# Patient Record
Sex: Female | Born: 1950 | Race: White | Hispanic: No | Smoking: Never smoker
Health system: Southern US, Community
[De-identification: ages and names within clinical notes are randomized; demographics above are authoritative.]

## PROBLEM LIST (undated history)

## (undated) DIAGNOSIS — T8859XA Other complications of anesthesia, initial encounter: Secondary | ICD-10-CM

## (undated) DIAGNOSIS — M1990 Unspecified osteoarthritis, unspecified site: Secondary | ICD-10-CM

## (undated) DIAGNOSIS — Z9889 Other specified postprocedural states: Secondary | ICD-10-CM

## (undated) DIAGNOSIS — R2231 Localized swelling, mass and lump, right upper limb: Secondary | ICD-10-CM

## (undated) DIAGNOSIS — Z8489 Family history of other specified conditions: Secondary | ICD-10-CM

## (undated) DIAGNOSIS — K219 Gastro-esophageal reflux disease without esophagitis: Secondary | ICD-10-CM

## (undated) DIAGNOSIS — R112 Nausea with vomiting, unspecified: Secondary | ICD-10-CM

## (undated) DIAGNOSIS — T4145XA Adverse effect of unspecified anesthetic, initial encounter: Secondary | ICD-10-CM

## (undated) DIAGNOSIS — C801 Malignant (primary) neoplasm, unspecified: Secondary | ICD-10-CM

## (undated) HISTORY — PX: BREAST SURGERY: SHX581

## (undated) HISTORY — PX: TONSILLECTOMY: SUR1361

## (undated) HISTORY — PX: TUBAL LIGATION: SHX77

## (undated) HISTORY — PX: OTHER SURGICAL HISTORY: SHX169

## (undated) HISTORY — PX: DILATION AND CURETTAGE OF UTERUS: SHX78

## (undated) HISTORY — PX: COLONOSCOPY: SHX174

## (undated) HISTORY — PX: ABDOMINAL HYSTERECTOMY: SHX81

## (undated) HISTORY — PX: CARPAL TUNNEL RELEASE: SHX101

---

## 1978-04-26 DIAGNOSIS — K759 Inflammatory liver disease, unspecified: Secondary | ICD-10-CM

## 1978-04-26 HISTORY — DX: Inflammatory liver disease, unspecified: K75.9

## 1999-02-09 ENCOUNTER — Other Ambulatory Visit: Admission: RE | Admit: 1999-02-09 | Discharge: 1999-02-09 | Payer: Self-pay | Admitting: Obstetrics and Gynecology

## 1999-05-20 ENCOUNTER — Ambulatory Visit (HOSPITAL_BASED_OUTPATIENT_CLINIC_OR_DEPARTMENT_OTHER): Admission: RE | Admit: 1999-05-20 | Discharge: 1999-05-20 | Payer: Self-pay | Admitting: Orthopedic Surgery

## 2000-10-07 ENCOUNTER — Other Ambulatory Visit: Admission: RE | Admit: 2000-10-07 | Discharge: 2000-10-07 | Payer: Self-pay | Admitting: Obstetrics and Gynecology

## 2000-12-30 ENCOUNTER — Ambulatory Visit (HOSPITAL_BASED_OUTPATIENT_CLINIC_OR_DEPARTMENT_OTHER): Admission: RE | Admit: 2000-12-30 | Discharge: 2000-12-30 | Payer: Self-pay | Admitting: Orthopedic Surgery

## 2001-01-20 ENCOUNTER — Ambulatory Visit (HOSPITAL_BASED_OUTPATIENT_CLINIC_OR_DEPARTMENT_OTHER): Admission: RE | Admit: 2001-01-20 | Discharge: 2001-01-20 | Payer: Self-pay | Admitting: Orthopedic Surgery

## 2006-02-17 ENCOUNTER — Ambulatory Visit: Payer: Self-pay | Admitting: Otolaryngology

## 2006-05-16 ENCOUNTER — Ambulatory Visit: Payer: Self-pay | Admitting: Unknown Physician Specialty

## 2013-02-27 DIAGNOSIS — J381 Polyp of vocal cord and larynx: Secondary | ICD-10-CM | POA: Insufficient documentation

## 2013-02-27 DIAGNOSIS — C50919 Malignant neoplasm of unspecified site of unspecified female breast: Secondary | ICD-10-CM | POA: Insufficient documentation

## 2013-02-27 DIAGNOSIS — R232 Flushing: Secondary | ICD-10-CM | POA: Insufficient documentation

## 2013-02-27 DIAGNOSIS — N6099 Unspecified benign mammary dysplasia of unspecified breast: Secondary | ICD-10-CM | POA: Insufficient documentation

## 2013-02-27 DIAGNOSIS — K621 Rectal polyp: Secondary | ICD-10-CM | POA: Insufficient documentation

## 2013-02-27 DIAGNOSIS — Z90711 Acquired absence of uterus with remaining cervical stump: Secondary | ICD-10-CM | POA: Insufficient documentation

## 2013-02-27 DIAGNOSIS — Z9089 Acquired absence of other organs: Secondary | ICD-10-CM | POA: Insufficient documentation

## 2013-05-26 ENCOUNTER — Ambulatory Visit: Payer: Self-pay | Admitting: Otolaryngology

## 2015-06-04 ENCOUNTER — Other Ambulatory Visit: Payer: Self-pay | Admitting: Ophthalmology

## 2015-06-04 DIAGNOSIS — H5711 Ocular pain, right eye: Secondary | ICD-10-CM

## 2015-06-20 ENCOUNTER — Ambulatory Visit: Payer: Self-pay

## 2015-06-27 ENCOUNTER — Ambulatory Visit: Admission: RE | Admit: 2015-06-27 | Payer: Commercial Managed Care - HMO | Source: Ambulatory Visit

## 2015-06-27 ENCOUNTER — Other Ambulatory Visit: Payer: Self-pay | Admitting: Ophthalmology

## 2015-06-27 ENCOUNTER — Ambulatory Visit
Admission: RE | Admit: 2015-06-27 | Discharge: 2015-06-27 | Disposition: A | Discharge status: Home / Self Care | Payer: Commercial Managed Care - HMO | Source: Ambulatory Visit | Attending: Ophthalmology | Admitting: Ophthalmology

## 2015-06-27 DIAGNOSIS — H5711 Ocular pain, right eye: Secondary | ICD-10-CM | POA: Insufficient documentation

## 2015-06-27 DIAGNOSIS — R9082 White matter disease, unspecified: Secondary | ICD-10-CM | POA: Insufficient documentation

## 2015-06-27 MED ORDER — GADOBENATE DIMEGLUMINE 529 MG/ML IV SOLN
15.0000 mL | Freq: Once | INTRAVENOUS | Status: AC | PRN
  Administered 2015-06-27: 15 mL via INTRAVENOUS

## 2016-10-21 ENCOUNTER — Encounter: Payer: Self-pay | Admitting: *Deleted

## 2016-10-26 ENCOUNTER — Ambulatory Visit: Payer: Commercial Managed Care - HMO | Admitting: Registered Nurse

## 2016-10-26 ENCOUNTER — Ambulatory Visit
Admission: RE | Admit: 2016-10-26 | Discharge: 2016-10-26 | Disposition: A | Discharge status: Home / Self Care | Payer: Commercial Managed Care - HMO | Source: Ambulatory Visit | Attending: Ophthalmology | Admitting: Ophthalmology

## 2016-10-26 ENCOUNTER — Encounter
Admission: RE | Disposition: A | Discharge status: Home / Self Care | Payer: Self-pay | Source: Ambulatory Visit | Attending: Ophthalmology

## 2016-10-26 ENCOUNTER — Encounter: Payer: Self-pay | Admitting: *Deleted

## 2016-10-26 DIAGNOSIS — H2512 Age-related nuclear cataract, left eye: Secondary | ICD-10-CM | POA: Insufficient documentation

## 2016-10-26 DIAGNOSIS — Z853 Personal history of malignant neoplasm of breast: Secondary | ICD-10-CM | POA: Insufficient documentation

## 2016-10-26 DIAGNOSIS — K219 Gastro-esophageal reflux disease without esophagitis: Secondary | ICD-10-CM | POA: Insufficient documentation

## 2016-10-26 DIAGNOSIS — M1990 Unspecified osteoarthritis, unspecified site: Secondary | ICD-10-CM | POA: Diagnosis not present

## 2016-10-26 DIAGNOSIS — Z888 Allergy status to other drugs, medicaments and biological substances status: Secondary | ICD-10-CM | POA: Insufficient documentation

## 2016-10-26 DIAGNOSIS — Z85828 Personal history of other malignant neoplasm of skin: Secondary | ICD-10-CM | POA: Insufficient documentation

## 2016-10-26 DIAGNOSIS — Z881 Allergy status to other antibiotic agents status: Secondary | ICD-10-CM | POA: Diagnosis not present

## 2016-10-26 HISTORY — DX: Adverse effect of unspecified anesthetic, initial encounter: T41.45XA

## 2016-10-26 HISTORY — DX: Gastro-esophageal reflux disease without esophagitis: K21.9

## 2016-10-26 HISTORY — DX: Malignant (primary) neoplasm, unspecified: C80.1

## 2016-10-26 HISTORY — DX: Nausea with vomiting, unspecified: R11.2

## 2016-10-26 HISTORY — DX: Other complications of anesthesia, initial encounter: T88.59XA

## 2016-10-26 HISTORY — DX: Unspecified osteoarthritis, unspecified site: M19.90

## 2016-10-26 HISTORY — PX: CATARACT EXTRACTION W/PHACO: SHX586

## 2016-10-26 HISTORY — DX: Other specified postprocedural states: Z98.890

## 2016-10-26 SURGERY — PHACOEMULSIFICATION, CATARACT, WITH IOL INSERTION
Anesthesia: Monitor Anesthesia Care | Site: Eye | Laterality: Left | Wound class: Clean

## 2016-10-26 MED ORDER — CARBACHOL 0.01 % IO SOLN
INTRAOCULAR | Status: DC | PRN
Start: 2016-10-26 — End: 2016-10-26
  Administered 2016-10-26: .5 mL via INTRAOCULAR

## 2016-10-26 MED ORDER — LIDOCAINE HCL (PF) 4 % IJ SOLN
INTRAOCULAR | Status: DC | PRN
  Administered 2016-10-26: 2 mL via OPHTHALMIC

## 2016-10-26 MED ORDER — BSS IO SOLN
INTRAOCULAR | Status: DC | PRN
  Administered 2016-10-26: 1 mL via OPHTHALMIC

## 2016-10-26 MED ORDER — NA CHONDROIT SULF-NA HYALURON 40-17 MG/ML IO SOLN
INTRAOCULAR | Status: AC
  Filled 2016-10-26: qty 1

## 2016-10-26 MED ORDER — ARMC OPHTHALMIC DILATING DROPS
OPHTHALMIC | Status: AC
  Filled 2016-10-26: qty 0.4

## 2016-10-26 MED ORDER — MOXIFLOXACIN HCL 0.5 % OP SOLN
OPHTHALMIC | Status: AC
  Filled 2016-10-26: qty 3

## 2016-10-26 MED ORDER — SODIUM CHLORIDE 0.9 % IV SOLN
INTRAVENOUS | Status: DC
  Administered 2016-10-26: 10:00:00 via INTRAVENOUS

## 2016-10-26 MED ORDER — MIDAZOLAM HCL 2 MG/2ML IJ SOLN
INTRAMUSCULAR | Status: AC
  Filled 2016-10-26: qty 2

## 2016-10-26 MED ORDER — POVIDONE-IODINE 5 % OP SOLN
OPHTHALMIC | Status: AC
  Filled 2016-10-26: qty 30

## 2016-10-26 MED ORDER — LIDOCAINE HCL (PF) 4 % IJ SOLN
INTRAMUSCULAR | Status: AC
  Filled 2016-10-26: qty 5

## 2016-10-26 MED ORDER — MOXIFLOXACIN HCL 0.5 % OP SOLN
OPHTHALMIC | Status: DC | PRN
Start: 2016-10-26 — End: 2016-10-26
  Administered 2016-10-26: .2 mL via OPHTHALMIC

## 2016-10-26 MED ORDER — MIDAZOLAM HCL 2 MG/2ML IJ SOLN
INTRAMUSCULAR | Status: DC | PRN
  Administered 2016-10-26: 1 mg via INTRAVENOUS

## 2016-10-26 MED ORDER — POVIDONE-IODINE 5 % OP SOLN
OPHTHALMIC | Status: DC | PRN
  Administered 2016-10-26: 1 via OPHTHALMIC

## 2016-10-26 MED ORDER — MOXIFLOXACIN HCL 0.5 % OP SOLN: 1.0000 [drp] | OPHTHALMIC | Status: DC | PRN

## 2016-10-26 MED ORDER — FENTANYL CITRATE (PF) 100 MCG/2ML IJ SOLN
INTRAMUSCULAR | Status: DC | PRN
  Administered 2016-10-26: 50 ug via INTRAVENOUS

## 2016-10-26 MED ORDER — NA CHONDROIT SULF-NA HYALURON 40-17 MG/ML IO SOLN
INTRAOCULAR | Status: DC | PRN
  Administered 2016-10-26: 1 mL via INTRAOCULAR

## 2016-10-26 MED ORDER — ONDANSETRON HCL 4 MG/2ML IJ SOLN
INTRAMUSCULAR | Status: AC
  Filled 2016-10-26: qty 2

## 2016-10-26 MED ORDER — ARMC OPHTHALMIC DILATING DROPS
1.0000 "application " | OPHTHALMIC | Status: AC
  Administered 2016-10-26 (×3): 1 via OPHTHALMIC

## 2016-10-26 MED ORDER — ONDANSETRON HCL 4 MG/2ML IJ SOLN
INTRAMUSCULAR | Status: DC | PRN
  Administered 2016-10-26: 4 mg via INTRAVENOUS

## 2016-10-26 MED ORDER — FENTANYL CITRATE (PF) 100 MCG/2ML IJ SOLN
INTRAMUSCULAR | Status: AC
  Filled 2016-10-26: qty 2

## 2016-10-26 MED ORDER — EPINEPHRINE PF 1 MG/ML IJ SOLN
INTRAMUSCULAR | Status: AC
  Filled 2016-10-26: qty 1

## 2016-10-26 SURGICAL SUPPLY — 14 items
GLOVE BIO SURGEON STRL SZ8 (GLOVE) ×2 IMPLANT
GLOVE BIOGEL M 6.5 STRL (GLOVE) ×2 IMPLANT
GLOVE SURG LX 8.0 MICRO (GLOVE) ×1
GLOVE SURG LX STRL 8.0 MICRO (GLOVE) ×1 IMPLANT
GOWN STRL REUS W/ TWL LRG LVL3 (GOWN DISPOSABLE) ×2 IMPLANT
GOWN STRL REUS W/TWL LRG LVL3 (GOWN DISPOSABLE) ×2
LENS IOL TECNIS ITEC 25.5 (Intraocular Lens) ×2 IMPLANT
PACK CATARACT (MISCELLANEOUS) ×2 IMPLANT
PACK CATARACT BRASINGTON LX (MISCELLANEOUS) ×2 IMPLANT
SOL BSS BAG (MISCELLANEOUS) ×2
SOLUTION BSS BAG (MISCELLANEOUS) ×1 IMPLANT
SYR 5ML LL (SYRINGE) ×2 IMPLANT
WATER STERILE IRR 250ML POUR (IV SOLUTION) ×2 IMPLANT
WIPE NON LINTING 3.25X3.25 (MISCELLANEOUS) ×2 IMPLANT

## 2016-10-26 NOTE — Anesthesia Preprocedure Evaluation (Signed)
Anesthesia Evaluation  Patient identified by MRN, date of birth, ID band Patient awake    Reviewed: Allergy & Precautions, H&P , NPO status , Patient's Chart, lab work & pertinent test results  History of Anesthesia Complications (+) PONV and history of anesthetic complications  Airway Mallampati: III  TM Distance: <3 FB Neck ROM: limited    Dental  (+) Chipped, Caps   Pulmonary neg pulmonary ROS,           Cardiovascular Exercise Tolerance: Good (-) angina(-) Past MI negative cardio ROS       Neuro/Psych negative neurological ROS  negative psych ROS   GI/Hepatic Neg liver ROS, GERD  Medicated and Controlled,  Endo/Other  negative endocrine ROS  Renal/GU   negative genitourinary   Musculoskeletal  (+) Arthritis ,   Abdominal   Peds  Hematology negative hematology ROS (+)   Anesthesia Other Findings Past Medical History: No date: Arthritis No date: Cancer (HCC)     Comment: BREAST No date: Complication of anesthesia     Comment: NO RECENT N/V No date: GERD (gastroesophageal reflux disease) No date: PONV (postoperative nausea and vomiting)     Comment: H/O  Past Surgical History: No date: ABDOMINAL HYSTERECTOMY No date: BREAST SURGERY     Comment: BX/ LUMPECTOMY No date: COLONOSCOPY No date: CTR No date: DILATION AND CURETTAGE OF UTERUS No date: TONSILLECTOMY No date: TUBAL LIGATION  BMI    Body Mass Index:  28.35 kg/m      Reproductive/Obstetrics negative OB ROS                             Anesthesia Physical Anesthesia Plan  ASA: III  Anesthesia Plan: MAC   Post-op Pain Management:    Induction: Intravenous  PONV Risk Score and Plan:   Airway Management Planned: Natural Airway and Nasal Cannula  Additional Equipment:   Intra-op Plan:   Post-operative Plan:   Informed Consent: I have reviewed the patients History and Physical, chart, labs and discussed  the procedure including the risks, benefits and alternatives for the proposed anesthesia with the patient or authorized representative who has indicated his/her understanding and acceptance.   Dental Advisory Given  Plan Discussed with: Anesthesiologist, CRNA and Surgeon  Anesthesia Plan Comments: (Patient consented for risks of anesthesia including but not limited to:  - adverse reactions to medications - damage to teeth, lips or other oral mucosa - sore throat or hoarseness - Damage to heart, brain, lungs or loss of life  Patient voiced understanding.)        Anesthesia Quick Evaluation

## 2016-10-26 NOTE — Op Note (Signed)
PREOPERATIVE DIAGNOSIS:  Nuclear sclerotic cataract of the left eye.   POSTOPERATIVE DIAGNOSIS:  Nuclear sclerotic cataract of the left eye.   OPERATIVE PROCEDURE: Procedure(s): CATARACT EXTRACTION PHACO AND INTRAOCULAR LENS PLACEMENT (IOC)   SURGEON:  Birder Robson, MD.   ANESTHESIA:  Anesthesiologist: Piscitello, Precious Haws, MD CRNA: Hedda Slade, CRNA  1.      Managed anesthesia care. 2.     0.73ml of Shugarcaine was instilled following the paracentesis   COMPLICATIONS:  None.   TECHNIQUE:   Stop and chop   DESCRIPTION OF PROCEDURE:  The patient was examined and consented in the preoperative holding area where the aforementioned topical anesthesia was applied to the left eye and then brought back to the Operating Room where the left eye was prepped and draped in the usual sterile ophthalmic fashion and a lid speculum was placed. A paracentesis was created with the side port blade and the anterior chamber was filled with viscoelastic. A near clear corneal incision was performed with the steel keratome. A continuous curvilinear capsulorrhexis was performed with a cystotome followed by the capsulorrhexis forceps. Hydrodissection and hydrodelineation were carried out with BSS on a blunt cannula. The lens was removed in a stop and chop  technique and the remaining cortical material was removed with the irrigation-aspiration handpiece. The capsular bag was inflated with viscoelastic and the Technis ZCB00 lens was placed in the capsular bag without complication. The remaining viscoelastic was removed from the eye with the irrigation-aspiration handpiece. The wounds were hydrated. The anterior chamber was flushed with Miostat and the eye was inflated to physiologic pressure. 0.64ml Vigamox was placed in the anterior chamber. The wounds were found to be water tight. The eye was dressed with Vigamox. The patient was given protective glasses to wear throughout the day and a shield with which to sleep  tonight. The patient was also given drops with which to begin a drop regimen today and will follow-up with me in one day.  Implant Name Type Inv. Item Serial No. Manufacturer Lot No. LRB No. Used  LENS IOL DIOP 25.5 - [id number] Intraocular Lens LENS IOL DIOP 25.5 [id number]   Left 1    Procedure(s) with comments: CATARACT EXTRACTION PHACO AND INTRAOCULAR LENS PLACEMENT (IOC) (Left) - [place] 00:43 AP% 12.0 CDE 5.20 Fluid pack lot # [id number]  Electronically signed: [place] 10/26/2016 10:52 AM

## 2016-10-26 NOTE — Anesthesia Post-op Follow-up Note (Cosign Needed)
Anesthesia QCDR form completed.        

## 2016-10-26 NOTE — H&P (Signed)
All labs reviewed. Abnormal studies sent to patients PCP when indicated.  Previous H&P reviewed, patient examined, there are NO CHANGES.  Savannah Griffin LOUIS7/3/201810:27 AM

## 2016-10-26 NOTE — Anesthesia Postprocedure Evaluation (Signed)
Anesthesia Post Note  Patient: Savannah Griffin  Procedure(s) Performed: Procedure(s) (LRB): CATARACT EXTRACTION PHACO AND INTRAOCULAR LENS PLACEMENT (IOC) (Left)  Patient location during evaluation: PACU Anesthesia Type: MAC Level of consciousness: awake Pain management: pain level controlled Vital Signs Assessment: post-procedure vital signs reviewed and stable Respiratory status: spontaneous breathing Cardiovascular status: blood pressure returned to baseline Postop Assessment: no signs of nausea or vomiting Anesthetic complications: no     Last Vitals:  Vitals:   10/26/16 0918 10/26/16 1053  BP: (!) 149/83 (!) 145/85  Pulse: 84 76  Resp: 18   Temp: (!) 35.8 C 37 C    Last Pain:  Vitals:   10/26/16 1053  TempSrc: Oral                 [employer] Lorenza Chick

## 2016-10-26 NOTE — Discharge Instructions (Signed)
Eye Surgery Discharge Instructions  Expect mild scratchy sensation or mild soreness. DO NOT RUB YOUR EYE!  The day of surgery:  Minimal physical activity, but bed rest is not required  No reading, computer work, or close hand work  No bending, lifting, or straining.  May watch TV  For 24 hours:  No driving, legal decisions, or alcoholic beverages  Safety precautions  Eat anything you prefer: It is better to start with liquids, then soup then solid foods.  _____ Eye patch should be worn until postoperative exam tomorrow.  ____ Solar shield eyeglasses should be worn for comfort in the sunlight/patch while sleeping  Resume all regular medications including aspirin or Coumadin if these were discontinued prior to surgery. You may shower, bathe, shave, or wash your hair. Tylenol may be taken for mild discomfort.  Call your doctor if you experience significant pain, nausea, or vomiting, fever > 101 or other signs of infection. [phone] or [phone] Specific instructions:  Follow-up Information    Birder Robson, MD Follow up on 10/26/2016.   Specialty:  Ophthalmology Why:  4:05 Contact information: [address] [place] [address] [phone]

## 2016-10-26 NOTE — Transfer of Care (Signed)
Immediate Anesthesia Transfer of Care Note  Patient: Savannah Griffin  Procedure(s) Performed: Procedure(s) with comments: CATARACT EXTRACTION PHACO AND INTRAOCULAR LENS PLACEMENT (IOC) (Left) - [place] 00:43 AP% 12.0 CDE 5.20 Fluid pack lot # [id number]  Patient Location: PACU  Anesthesia Type:MAC  Level of Consciousness: awake, alert  and oriented  Airway & Oxygen Therapy: Patient Spontanous Breathing  Post-op Assessment: Report given to RN and Post -op Vital signs reviewed and stable  Post vital signs: Reviewed and stable  Last Vitals:  Vitals:   10/26/16 0918 10/26/16 1053  BP: (!) 149/83 (!) 145/85  Pulse: 84 76  Resp: 18   Temp: (!) 35.8 C 37 C    Last Pain:  Vitals:   10/26/16 1053  TempSrc: Oral         Complications: No apparent anesthesia complications

## 2016-10-28 ENCOUNTER — Encounter: Payer: Self-pay | Admitting: Ophthalmology

## 2016-11-10 ENCOUNTER — Encounter: Payer: Self-pay | Admitting: *Deleted

## 2016-11-16 ENCOUNTER — Ambulatory Visit
Admission: RE | Admit: 2016-11-16 | Discharge: 2016-11-16 | Disposition: A | Discharge status: Home / Self Care | Payer: Commercial Managed Care - HMO | Source: Ambulatory Visit | Attending: Ophthalmology | Admitting: Ophthalmology

## 2016-11-16 ENCOUNTER — Ambulatory Visit: Payer: Commercial Managed Care - HMO | Admitting: Anesthesiology

## 2016-11-16 ENCOUNTER — Encounter
Admission: RE | Disposition: A | Discharge status: Home / Self Care | Payer: Self-pay | Source: Ambulatory Visit | Attending: Ophthalmology

## 2016-11-16 ENCOUNTER — Encounter: Payer: Self-pay | Admitting: *Deleted

## 2016-11-16 DIAGNOSIS — Z881 Allergy status to other antibiotic agents status: Secondary | ICD-10-CM | POA: Insufficient documentation

## 2016-11-16 DIAGNOSIS — M1990 Unspecified osteoarthritis, unspecified site: Secondary | ICD-10-CM | POA: Insufficient documentation

## 2016-11-16 DIAGNOSIS — Z853 Personal history of malignant neoplasm of breast: Secondary | ICD-10-CM | POA: Diagnosis not present

## 2016-11-16 DIAGNOSIS — K219 Gastro-esophageal reflux disease without esophagitis: Secondary | ICD-10-CM | POA: Insufficient documentation

## 2016-11-16 DIAGNOSIS — Z85828 Personal history of other malignant neoplasm of skin: Secondary | ICD-10-CM | POA: Insufficient documentation

## 2016-11-16 DIAGNOSIS — H2511 Age-related nuclear cataract, right eye: Secondary | ICD-10-CM | POA: Diagnosis present

## 2016-11-16 HISTORY — PX: CATARACT EXTRACTION W/PHACO: SHX586

## 2016-11-16 SURGERY — PHACOEMULSIFICATION, CATARACT, WITH IOL INSERTION
Anesthesia: Monitor Anesthesia Care | Site: Eye | Laterality: Right | Wound class: Clean

## 2016-11-16 MED ORDER — HYALURONIDASE HUMAN 150 UNIT/ML IJ SOLN
INTRAMUSCULAR | Status: AC
  Filled 2016-11-16: qty 1

## 2016-11-16 MED ORDER — MOXIFLOXACIN HCL 0.5 % OP SOLN
OPHTHALMIC | Status: AC
  Filled 2016-11-16: qty 3

## 2016-11-16 MED ORDER — EPINEPHRINE PF 1 MG/ML IJ SOLN
INTRAMUSCULAR | Status: AC
  Filled 2016-11-16: qty 1

## 2016-11-16 MED ORDER — ONDANSETRON HCL 4 MG/2ML IJ SOLN
INTRAMUSCULAR | Status: AC
  Filled 2016-11-16: qty 2

## 2016-11-16 MED ORDER — MIDAZOLAM HCL 2 MG/2ML IJ SOLN
INTRAMUSCULAR | Status: AC
  Filled 2016-11-16: qty 2

## 2016-11-16 MED ORDER — NA CHONDROIT SULF-NA HYALURON 40-17 MG/ML IO SOLN
INTRAOCULAR | Status: DC | PRN
  Administered 2016-11-16: 1 mL via INTRAOCULAR

## 2016-11-16 MED ORDER — LIDOCAINE HCL (PF) 4 % IJ SOLN
INTRAMUSCULAR | Status: AC
  Filled 2016-11-16: qty 5

## 2016-11-16 MED ORDER — EPINEPHRINE PF 1 MG/ML IJ SOLN
INTRAMUSCULAR | Status: DC | PRN
  Administered 2016-11-16: 12:00:00 via OPHTHALMIC

## 2016-11-16 MED ORDER — FENTANYL CITRATE (PF) 100 MCG/2ML IJ SOLN
INTRAMUSCULAR | Status: AC
  Filled 2016-11-16: qty 2

## 2016-11-16 MED ORDER — SODIUM CHLORIDE 0.9 % IV SOLN
INTRAVENOUS | Status: DC
  Administered 2016-11-16: 11:00:00 via INTRAVENOUS

## 2016-11-16 MED ORDER — MIDAZOLAM HCL 2 MG/2ML IJ SOLN
INTRAMUSCULAR | Status: DC | PRN
  Administered 2016-11-16: 1 mg via INTRAVENOUS

## 2016-11-16 MED ORDER — POVIDONE-IODINE 5 % OP SOLN
OPHTHALMIC | Status: DC | PRN
  Administered 2016-11-16: 1 via OPHTHALMIC

## 2016-11-16 MED ORDER — MOXIFLOXACIN HCL 0.5 % OP SOLN: 1.0000 [drp] | OPHTHALMIC | Status: DC | PRN

## 2016-11-16 MED ORDER — LIDOCAINE HCL (PF) 4 % IJ SOLN
INTRAMUSCULAR | Status: DC | PRN
  Administered 2016-11-16: 4 mL via OPHTHALMIC

## 2016-11-16 MED ORDER — FENTANYL CITRATE (PF) 100 MCG/2ML IJ SOLN
INTRAMUSCULAR | Status: DC | PRN
  Administered 2016-11-16: 50 ug via INTRAVENOUS

## 2016-11-16 MED ORDER — ARMC OPHTHALMIC DILATING DROPS
1.0000 "application " | OPHTHALMIC | Status: AC
  Administered 2016-11-16 (×3): 1 via OPHTHALMIC

## 2016-11-16 MED ORDER — CARBACHOL 0.01 % IO SOLN
INTRAOCULAR | Status: DC | PRN
  Administered 2016-11-16: 0.5 mL via INTRAOCULAR

## 2016-11-16 MED ORDER — ARMC OPHTHALMIC DILATING DROPS
OPHTHALMIC | Status: AC
  Administered 2016-11-16: 1 via OPHTHALMIC
  Filled 2016-11-16: qty 0.4

## 2016-11-16 MED ORDER — ONDANSETRON HCL 4 MG/2ML IJ SOLN
INTRAMUSCULAR | Status: DC | PRN
  Administered 2016-11-16: 4 mg via INTRAVENOUS

## 2016-11-16 MED ORDER — MOXIFLOXACIN HCL 0.5 % OP SOLN
OPHTHALMIC | Status: DC | PRN
  Administered 2016-11-16: 0.2 mL via OPHTHALMIC

## 2016-11-16 MED ORDER — NA CHONDROIT SULF-NA HYALURON 40-17 MG/ML IO SOLN
INTRAOCULAR | Status: AC
Start: 2016-11-16 — End: 2016-11-16
  Filled 2016-11-16: qty 1

## 2016-11-16 MED ORDER — POVIDONE-IODINE 5 % OP SOLN
OPHTHALMIC | Status: AC
  Filled 2016-11-16: qty 30

## 2016-11-16 SURGICAL SUPPLY — 16 items
GLOVE BIO SURGEON STRL SZ8 (GLOVE) ×2 IMPLANT
GLOVE BIOGEL M 6.5 STRL (GLOVE) ×2 IMPLANT
GLOVE SURG LX 8.0 MICRO (GLOVE) ×1
GLOVE SURG LX STRL 8.0 MICRO (GLOVE) ×1 IMPLANT
GOWN STRL REUS W/ TWL LRG LVL3 (GOWN DISPOSABLE) ×2 IMPLANT
GOWN STRL REUS W/TWL LRG LVL3 (GOWN DISPOSABLE) ×2
LABEL CATARACT MEDS ST (LABEL) ×2 IMPLANT
LENS IOL TECNIS ITEC 25.5 (Intraocular Lens) ×2 IMPLANT
PACK CATARACT (MISCELLANEOUS) ×2 IMPLANT
PACK CATARACT BRASINGTON LX (MISCELLANEOUS) ×2 IMPLANT
PACK EYE AFTER SURG (MISCELLANEOUS) ×2 IMPLANT
SOL BSS BAG (MISCELLANEOUS) ×2
SOLUTION BSS BAG (MISCELLANEOUS) ×1 IMPLANT
SYR 5ML LL (SYRINGE) ×2 IMPLANT
WATER STERILE IRR 250ML POUR (IV SOLUTION) ×2 IMPLANT
WIPE NON LINTING 3.25X3.25 (MISCELLANEOUS) ×2 IMPLANT

## 2016-11-16 NOTE — Anesthesia Postprocedure Evaluation (Signed)
Anesthesia Post Note  Patient: NEVAE PINNIX  Procedure(s) Performed: Procedure(s) (LRB): CATARACT EXTRACTION PHACO AND INTRAOCULAR LENS PLACEMENT (IOC) (Right)  Patient location during evaluation: PACU Anesthesia Type: MAC Level of consciousness: awake and alert Pain management: pain level controlled Vital Signs Assessment: post-procedure vital signs reviewed and stable Respiratory status: spontaneous breathing, nonlabored ventilation, respiratory function stable and patient connected to nasal cannula oxygen Cardiovascular status: stable and blood pressure returned to baseline Anesthetic complications: no     Last Vitals:  Vitals:   11/16/16 1047 11/16/16 1208  BP: 131/72 132/77  Pulse: 98 80  Resp: 20 16  Temp: 36.6 C     Last Pain:  Vitals:   11/16/16 1208  TempSrc: Oral                 Darlyne Russian

## 2016-11-16 NOTE — H&P (Signed)
All labs reviewed. Abnormal studies sent to patients PCP when indicated.  Previous H&P reviewed, patient examined, there are NO CHANGES.  Savannah Byron LOUIS7/24/201811:41 AM

## 2016-11-16 NOTE — Anesthesia Procedure Notes (Signed)
Procedure Name: MAC Date/Time: 11/16/2016 11:49 AM Performed by: Darlyne Russian Pre-anesthesia Checklist: Patient identified, Emergency Drugs available, Suction available, Patient being monitored and Timeout performed Oxygen Delivery Method: Nasal cannula Placement Confirmation: positive ETCO2

## 2016-11-16 NOTE — Transfer of Care (Signed)
Immediate Anesthesia Transfer of Care Note  Patient: Savannah Griffin  Procedure(s) Performed: Procedure(s) with comments: CATARACT EXTRACTION PHACO AND INTRAOCULAR LENS PLACEMENT (IOC) (Right) - [place] 00:45.9 AP% 13.4 CDE 6.16 Fluid Pack lot # [id number]  Patient Location: PACU  Anesthesia Type:MAC  Level of Consciousness: awake, alert  and oriented  Airway & Oxygen Therapy: Patient Spontanous Breathing  Post-op Assessment: Report given to RN and Post -op Vital signs reviewed and stable  Post vital signs: Reviewed and stable  Last Vitals:  Vitals:   11/16/16 1047  BP: 131/72  Pulse: 98  Resp: 20  Temp: 36.6 C    Last Pain:  Vitals:   11/16/16 1047  TempSrc: Tympanic      Patients Stated Pain Goal: 0 (83/41/96 2229)  Complications: No apparent anesthesia complications

## 2016-11-16 NOTE — Op Note (Signed)
PREOPERATIVE DIAGNOSIS:  Nuclear sclerotic cataract of the right eye.   POSTOPERATIVE DIAGNOSIS:  NUCLEAR SCLEROTIC CATARACT RIGHT EYE   OPERATIVE PROCEDURE: Procedure(s): CATARACT EXTRACTION PHACO AND INTRAOCULAR LENS PLACEMENT (IOC)   SURGEON:  Birder Robson, MD.   ANESTHESIA:  Anesthesiologist: Emmie Niemann, MD CRNA: Darlyne Russian, CRNA  1.      Managed anesthesia care. 2.      0.85ml of Shugarcaine was instilled in the eye following the paracentesis.   COMPLICATIONS:  None.   TECHNIQUE:   Stop and chop   DESCRIPTION OF PROCEDURE:  The patient was examined and consented in the preoperative holding area where the aforementioned topical anesthesia was applied to the right eye and then brought back to the Operating Room where the right eye was prepped and draped in the usual sterile ophthalmic fashion and a lid speculum was placed. A paracentesis was created with the side port blade and the anterior chamber was filled with viscoelastic. A near clear corneal incision was performed with the steel keratome. A continuous curvilinear capsulorrhexis was performed with a cystotome followed by the capsulorrhexis forceps. Hydrodissection and hydrodelineation were carried out with BSS on a blunt cannula. The lens was removed in a stop and chop  technique and the remaining cortical material was removed with the irrigation-aspiration handpiece. The capsular bag was inflated with viscoelastic and the Technis ZCB00  lens was placed in the capsular bag without complication. The remaining viscoelastic was removed from the eye with the irrigation-aspiration handpiece. The wounds were hydrated. The anterior chamber was flushed with Miostat and the eye was inflated to physiologic pressure. 0.32ml of Vigamox was placed in the anterior chamber. The wounds were found to be water tight. The eye was dressed with Vigamox. The patient was given protective glasses to wear throughout the day and a shield with which to  sleep tonight. The patient was also given drops with which to begin a drop regimen today and will follow-up with me in one day.  Implant Name Type Inv. Item Serial No. Manufacturer Lot No. LRB No. Used  LENS IOL DIOP 25.5 - [id number] Intraocular Lens LENS IOL DIOP 25.5 [id number] AMO   Right 1   Procedure(s) with comments: CATARACT EXTRACTION PHACO AND INTRAOCULAR LENS PLACEMENT (IOC) (Right) - [place] 00:45.9 AP% 13.4 CDE 6.16 Fluid Pack lot # [id number]  Electronically signed: [place] 11/16/2016 12:06 PM

## 2016-11-16 NOTE — Anesthesia Preprocedure Evaluation (Signed)
Anesthesia Evaluation  Patient identified by MRN, date of birth, ID band Patient awake    Reviewed: Allergy & Precautions, NPO status , Patient's Chart, lab work & pertinent test results  History of Anesthesia Complications (+) PONV and history of anesthetic complications (remote hx of PONV in 70s)  Airway Mallampati: II  TM Distance: >3 FB Neck ROM: Full    Dental no notable dental hx.    Pulmonary neg pulmonary ROS, neg sleep apnea, neg COPD,    breath sounds clear to auscultation- rhonchi (-) wheezing      Cardiovascular Exercise Tolerance: Good (-) hypertension(-) CAD and (-) Past MI  Rhythm:Regular Rate:Normal - Systolic murmurs and - Diastolic murmurs    Neuro/Psych negative neurological ROS  negative psych ROS   GI/Hepatic Neg liver ROS, GERD  ,  Endo/Other  negative endocrine ROSneg diabetes  Renal/GU negative Renal ROS     Musculoskeletal  (+) Arthritis ,   Abdominal (+) - obese,   Peds  Hematology negative hematology ROS (+)   Anesthesia Other Findings Past Medical History: No date: Arthritis No date: Cancer ([place])     Comment:  BREAST No date: Complication of anesthesia     Comment:  NO RECENT N/V No date: GERD (gastroesophageal reflux disease) No date: PONV (postoperative nausea and vomiting)     Comment:  H/O  NONE WITH RECENT SURGIES   Reproductive/Obstetrics                             Anesthesia Physical Anesthesia Plan  ASA: II  Anesthesia Plan: MAC   Post-op Pain Management:    Induction: Intravenous  PONV Risk Score and Plan: 3 and Midazolam  Airway Management Planned: Natural Airway  Additional Equipment:   Intra-op Plan:   Post-operative Plan:   Informed Consent: I have reviewed the patients History and Physical, chart, labs and discussed the procedure including the risks, benefits and alternatives for the proposed anesthesia with the patient or  authorized representative who has indicated his/her understanding and acceptance.     Plan Discussed with: CRNA and Anesthesiologist  Anesthesia Plan Comments:         Anesthesia Quick Evaluation

## 2016-11-16 NOTE — Anesthesia Post-op Follow-up Note (Cosign Needed)
Anesthesia QCDR form completed.        

## 2016-11-16 NOTE — Discharge Instructions (Signed)
Eye Surgery Discharge Instructions  Expect mild scratchy sensation or mild soreness. DO NOT RUB YOUR EYE!  The day of surgery:  Minimal physical activity, but bed rest is not required  No reading, computer work, or close hand work  No bending, lifting, or straining.  May watch TV  For 24 hours:  No driving, legal decisions, or alcoholic beverages  Safety precautions  Eat anything you prefer: It is better to start with liquids, then soup then solid foods.  _____ Eye patch should be worn until postoperative exam tomorrow.  ____ Solar shield eyeglasses should be worn for comfort in the sunlight/patch while sleeping  Resume all regular medications including aspirin or Coumadin if these were discontinued prior to surgery. You may shower, bathe, shave, or wash your hair. Tylenol may be taken for mild discomfort.  Call your doctor if you experience significant pain, nausea, or vomiting, fever > 101 or other signs of infection. [phone] or [phone] Specific instructions:  Follow-up Information    Birder Robson, MD Follow up on 11/17/2016.   Specialty:  Ophthalmology Why:  10:35 Contact information: [address] [place] [address] [phone]

## 2017-10-12 ENCOUNTER — Encounter: Payer: Self-pay | Admitting: Obstetrics and Gynecology

## 2017-10-12 ENCOUNTER — Ambulatory Visit (INDEPENDENT_AMBULATORY_CARE_PROVIDER_SITE_OTHER): Payer: 59 | Admitting: Obstetrics and Gynecology

## 2017-10-12 VITALS — BP 120/78 | HR 83 | Ht 62.0 in | Wt 157.4 lb

## 2017-10-12 DIAGNOSIS — N951 Menopausal and female climacteric states: Secondary | ICD-10-CM

## 2017-10-12 DIAGNOSIS — Z853 Personal history of malignant neoplasm of breast: Secondary | ICD-10-CM

## 2017-10-12 NOTE — Progress Notes (Signed)
Pt stated that she is having hot flashes, night sweats, hard time sleeping and gaining wt. No other concerns.

## 2017-10-12 NOTE — Progress Notes (Signed)
HPI:      [name] is a 67 y.o. [phone] who LMP was No LMP recorded. Patient has had a hysterectomy.  Subjective:   She presents today with complaint of hot flashes, night sweats, weight gain.  She states that she "just cannot take it anymore and wants something done".  This is significant for her because she has a history of breast cancer diagnosed premenopausally.  She was on tamoxifen for 2 years.  She has now been cancer free for 18 years.  She has usually been told that she cannot take estrogen because of her breast cancer.  She has tried multiple over-the-counter and prescription medications without success.  These have included black cohosh and gabapentin.  She does not have diabetes hypertension or hypercholesterolemia.    Hx: The following portions of the patient's history were reviewed and updated as appropriate:             She  has a past medical history of Arthritis, Cancer ([place]), Complication of anesthesia, GERD (gastroesophageal reflux disease), and PONV (postoperative nausea and vomiting). She does not have a problem list on file. She  has a past surgical history that includes Dilation and curettage of uterus; Tubal ligation; Abdominal hysterectomy; Breast surgery; CTR; Tonsillectomy; Colonoscopy; Cataract extraction w/PHACO (Left, 10/26/2016); Cataract extraction w/PHACO (Right, 11/16/2016); Carpal tunnel release; and needle core biopsy. Her family history is not on file. She  reports that she has never smoked. She has never used smokeless tobacco. She reports that she has current or past drug history. She reports that she does not drink alcohol. She has a current medication list which includes the following prescription(s): acetaminophen and olopatadine hcl. She is allergic to augmentin [amoxicillin-pot clavulanate]; biaxin [clarithromycin]; cefdinir; clindamycin/lincomycin; macrodantin [nitrofurantoin macrocrystal]; and septra [sulfamethoxazole-trimethoprim].       Review of  Systems:  Review of Systems  Constitutional: Denied constitutional symptoms, night sweats, recent illness, fatigue, fever, insomnia and weight loss.  Eyes: Denied eye symptoms, eye pain, photophobia, vision change and visual disturbance.  Ears/Nose/Throat/Neck: Denied ear, nose, throat or neck symptoms, hearing loss, nasal discharge, sinus congestion and sore throat.  Cardiovascular: Denied cardiovascular symptoms, arrhythmia, chest pain/pressure, edema, exercise intolerance, orthopnea and palpitations.  Respiratory: Denied pulmonary symptoms, asthma, pleuritic pain, productive sputum, cough, dyspnea and wheezing.  Gastrointestinal: Denied, gastro-esophageal reflux, melena, nausea and vomiting.  Genitourinary: Denied genitourinary symptoms including symptomatic vaginal discharge, pelvic relaxation issues, and urinary complaints.  Musculoskeletal: Denied musculoskeletal symptoms, stiffness, swelling, muscle weakness and myalgia.  Dermatologic: Denied dermatology symptoms, rash and scar.  Neurologic: Denied neurology symptoms, dizziness, headache, neck pain and syncope.  Psychiatric: Denied psychiatric symptoms, anxiety and depression.  Endocrine: See HPI for additional information.   Meds:   Current Outpatient Medications on File Prior to Visit  Medication Sig Dispense Refill  . acetaminophen (TYLENOL) 500 MG tablet Take 500-1,000 mg by mouth every 8 (eight) hours as needed for mild pain or moderate pain.    Marland Kitchen Olopatadine HCl (PATANASE) 0.6 % SOLN Place 1 spray into both nostrils daily as needed (allergies prn).      No current facility-administered medications on file prior to visit.     Objective:     Vitals:   10/12/17 0926  BP: 120/78  Pulse: 83                Assessment:    G2P2002 There are no active problems to display for this patient.    1. Symptomatic menopausal or female climacteric  states   2. Personal history of breast cancer     Patient describes her life as  "miserable" because of her menopausal symptoms.  The concern of course is her history of breast cancer.   Plan:            1.  We have specifically discussed the unknown risks of estrogen and breast cancer and she has been made aware that estrogen is usually considered contraindicated in patients with a history of breast cancer.  However she has been cancer free for 18 years and thus her risk should be low.  We have also discussed the women's health initiative in detail specifically discussing the increased age risk for those over 67 which involves increased risk of heart disease and stroke for the first year of use.  (see paragraph below) HRT I have discussed HRT with the patient in detail.  The risk/benefits of it were reviewed.  She understands that during menopause Estrogen decreases dramatically and that this results in an increased risk of cardiovascular disease as well as osteoporosis.  We have also discussed the fact that hot flashes often result from a decrease in Estrogen, and that by replacing Estrogen, they can often be alleviated.  We have discussed skin, vaginal and urinary tract changes that may also take place from this drop in Estrogen.  Emotional changes have also been linked to Estrogen and we have briefly discussed this.  The benefits of HRT including decrease in hot flashes, vaginal dryness, and osteoporosis were discussed.  The emotional benefit and a possible change in her cardiovascular risk profile was also reviewed.  The risks associated with Hormone Replacement Therapy were also reviewed.  The use of unopposed Estrogen and its relationship to endometrial cancer was discussed.  The addition of Progesterone and its beneficial effect on endometrial cancer was also noted.  The fact that there has been no consistent definitive studies showing an increase in breast cancer in women who use HRT was discussed with the patient.  The possible side effects including breast tenderness, fluid  retention, mood changes and vaginal bleeding were discussed.  The patient was informed that this is an elective medication and that she may choose not to take Hormone Replacement Therapy.  Literature on HRT was given, and I believe that after answering all of the patient's questions, she has an adequate and informed understanding of HRT.  Special emphasis on the WHI study, as well as several studies since that pertaining to the risks and benefits of estrogen replacement therapy were compared.  The possible limitations of these studies were discussed including the age stratification of the WHI study.  The possible role of Progesterone in these studies was discussed in detail.  I believe that the patient has an informed knowledge of the risks and benefits of HRT.  I have specifically discussed WHI findings and current updates.  Different type of hormone formulation and methods of taking hormone replacement therapy discussed.    Orders No orders of the defined types were placed in this encounter.   No orders of the defined types were placed in this encounter.     F/U  No follow-ups on file. I spent 33 minutes involved in the care of this patient of which greater than 50% was spent discussing hormone replacement therapy-specifically estrogen replacement therapy and its risk with breast cancer as well as the possible risk of heart disease and stroke.  Please see above.  All of her questions were answered.  Grayling Congress.  Amalia Hailey, [name] 10/12/2017 10:29 AM

## 2018-05-03 DIAGNOSIS — F411 Generalized anxiety disorder: Secondary | ICD-10-CM | POA: Diagnosis not present

## 2018-05-03 DIAGNOSIS — M170 Bilateral primary osteoarthritis of knee: Secondary | ICD-10-CM | POA: Diagnosis not present

## 2018-05-03 DIAGNOSIS — Z0000 Encounter for general adult medical examination without abnormal findings: Secondary | ICD-10-CM | POA: Diagnosis not present

## 2018-05-03 DIAGNOSIS — Z13220 Encounter for screening for lipoid disorders: Secondary | ICD-10-CM | POA: Diagnosis not present

## 2018-05-16 DIAGNOSIS — D2261 Melanocytic nevi of right upper limb, including shoulder: Secondary | ICD-10-CM | POA: Diagnosis not present

## 2018-05-16 DIAGNOSIS — D2271 Melanocytic nevi of right lower limb, including hip: Secondary | ICD-10-CM | POA: Diagnosis not present

## 2018-05-16 DIAGNOSIS — L570 Actinic keratosis: Secondary | ICD-10-CM | POA: Diagnosis not present

## 2018-05-16 DIAGNOSIS — D2262 Melanocytic nevi of left upper limb, including shoulder: Secondary | ICD-10-CM | POA: Diagnosis not present

## 2018-05-16 DIAGNOSIS — X32XXXA Exposure to sunlight, initial encounter: Secondary | ICD-10-CM | POA: Diagnosis not present

## 2018-05-16 DIAGNOSIS — D225 Melanocytic nevi of trunk: Secondary | ICD-10-CM | POA: Diagnosis not present

## 2018-05-16 DIAGNOSIS — D2272 Melanocytic nevi of left lower limb, including hip: Secondary | ICD-10-CM | POA: Diagnosis not present

## 2018-05-19 DIAGNOSIS — M79644 Pain in right finger(s): Secondary | ICD-10-CM | POA: Diagnosis not present

## 2018-05-23 ENCOUNTER — Other Ambulatory Visit: Payer: Self-pay | Admitting: Orthopedic Surgery

## 2018-05-23 DIAGNOSIS — M79644 Pain in right finger(s): Secondary | ICD-10-CM

## 2018-06-06 ENCOUNTER — Other Ambulatory Visit: Payer: Self-pay | Admitting: Orthopedic Surgery

## 2018-06-06 ENCOUNTER — Ambulatory Visit
Admission: RE | Admit: 2018-06-06 | Discharge: 2018-06-06 | Disposition: A | Discharge status: Home / Self Care | Payer: Medicare Other | Source: Ambulatory Visit | Attending: Orthopedic Surgery | Admitting: Orthopedic Surgery

## 2018-06-06 DIAGNOSIS — M79644 Pain in right finger(s): Secondary | ICD-10-CM

## 2018-06-06 DIAGNOSIS — M79621 Pain in right upper arm: Secondary | ICD-10-CM | POA: Diagnosis not present

## 2018-06-14 ENCOUNTER — Other Ambulatory Visit: Payer: Self-pay | Admitting: Orthopedic Surgery

## 2018-06-14 DIAGNOSIS — R2231 Localized swelling, mass and lump, right upper limb: Secondary | ICD-10-CM | POA: Diagnosis not present

## 2018-06-16 DIAGNOSIS — J029 Acute pharyngitis, unspecified: Secondary | ICD-10-CM | POA: Diagnosis not present

## 2018-06-16 DIAGNOSIS — J069 Acute upper respiratory infection, unspecified: Secondary | ICD-10-CM | POA: Diagnosis not present

## 2018-06-19 ENCOUNTER — Encounter (HOSPITAL_BASED_OUTPATIENT_CLINIC_OR_DEPARTMENT_OTHER): Payer: Self-pay

## 2018-06-19 ENCOUNTER — Other Ambulatory Visit: Payer: Self-pay

## 2018-07-21 ENCOUNTER — Ambulatory Visit (HOSPITAL_BASED_OUTPATIENT_CLINIC_OR_DEPARTMENT_OTHER): Admission: RE | Admit: 2018-07-21 | Payer: Medicare Other | Source: Home / Self Care | Admitting: Orthopedic Surgery

## 2018-07-21 SURGERY — EXCISION MASS
Anesthesia: Regional | Laterality: Right

## 2018-08-31 ENCOUNTER — Other Ambulatory Visit: Payer: Self-pay | Admitting: Orthopedic Surgery

## 2018-09-01 ENCOUNTER — Other Ambulatory Visit: Payer: Self-pay | Admitting: Orthopedic Surgery

## 2018-09-04 ENCOUNTER — Other Ambulatory Visit: Payer: Self-pay

## 2018-09-04 ENCOUNTER — Encounter (HOSPITAL_BASED_OUTPATIENT_CLINIC_OR_DEPARTMENT_OTHER): Payer: Self-pay | Admitting: *Deleted

## 2018-09-06 ENCOUNTER — Other Ambulatory Visit (HOSPITAL_COMMUNITY): Admission: RE | Admit: 2018-09-06 | Payer: Medicare Other | Source: Ambulatory Visit

## 2018-09-08 ENCOUNTER — Ambulatory Visit (HOSPITAL_BASED_OUTPATIENT_CLINIC_OR_DEPARTMENT_OTHER): Admission: RE | Admit: 2018-09-08 | Payer: Medicare Other | Source: Home / Self Care | Admitting: Orthopedic Surgery

## 2018-09-08 HISTORY — DX: Localized swelling, mass and lump, right upper limb: R22.31

## 2018-09-08 SURGERY — EXCISION MASS
Anesthesia: Regional | Laterality: Right

## 2018-10-18 DIAGNOSIS — H43813 Vitreous degeneration, bilateral: Secondary | ICD-10-CM | POA: Diagnosis not present

## 2018-12-22 DIAGNOSIS — R300 Dysuria: Secondary | ICD-10-CM | POA: Diagnosis not present

## 2018-12-22 DIAGNOSIS — R350 Frequency of micturition: Secondary | ICD-10-CM | POA: Diagnosis not present

## 2019-01-29 DIAGNOSIS — Z23 Encounter for immunization: Secondary | ICD-10-CM | POA: Diagnosis not present

## 2019-05-08 DIAGNOSIS — Z780 Asymptomatic menopausal state: Secondary | ICD-10-CM | POA: Diagnosis not present

## 2019-05-08 DIAGNOSIS — Z0000 Encounter for general adult medical examination without abnormal findings: Secondary | ICD-10-CM | POA: Diagnosis not present

## 2019-05-08 DIAGNOSIS — Z1231 Encounter for screening mammogram for malignant neoplasm of breast: Secondary | ICD-10-CM | POA: Diagnosis not present

## 2019-05-08 DIAGNOSIS — F411 Generalized anxiety disorder: Secondary | ICD-10-CM | POA: Diagnosis not present

## 2019-05-11 DIAGNOSIS — M8588 Other specified disorders of bone density and structure, other site: Secondary | ICD-10-CM | POA: Diagnosis not present

## 2019-05-16 DIAGNOSIS — D225 Melanocytic nevi of trunk: Secondary | ICD-10-CM | POA: Diagnosis not present

## 2019-05-16 DIAGNOSIS — X32XXXA Exposure to sunlight, initial encounter: Secondary | ICD-10-CM | POA: Diagnosis not present

## 2019-05-16 DIAGNOSIS — L708 Other acne: Secondary | ICD-10-CM | POA: Diagnosis not present

## 2019-05-16 DIAGNOSIS — D2271 Melanocytic nevi of right lower limb, including hip: Secondary | ICD-10-CM | POA: Diagnosis not present

## 2019-05-16 DIAGNOSIS — L538 Other specified erythematous conditions: Secondary | ICD-10-CM | POA: Diagnosis not present

## 2019-05-16 DIAGNOSIS — D2272 Melanocytic nevi of left lower limb, including hip: Secondary | ICD-10-CM | POA: Diagnosis not present

## 2019-05-16 DIAGNOSIS — D2262 Melanocytic nevi of left upper limb, including shoulder: Secondary | ICD-10-CM | POA: Diagnosis not present

## 2019-05-16 DIAGNOSIS — R208 Other disturbances of skin sensation: Secondary | ICD-10-CM | POA: Diagnosis not present

## 2019-05-16 DIAGNOSIS — L570 Actinic keratosis: Secondary | ICD-10-CM | POA: Diagnosis not present

## 2019-05-16 DIAGNOSIS — L820 Inflamed seborrheic keratosis: Secondary | ICD-10-CM | POA: Diagnosis not present

## 2019-05-16 DIAGNOSIS — D2261 Melanocytic nevi of right upper limb, including shoulder: Secondary | ICD-10-CM | POA: Diagnosis not present

## 2019-05-16 DIAGNOSIS — L821 Other seborrheic keratosis: Secondary | ICD-10-CM | POA: Diagnosis not present

## 2019-06-16 ENCOUNTER — Ambulatory Visit: Payer: Medicare Other | Attending: Internal Medicine

## 2019-06-16 DIAGNOSIS — Z23 Encounter for immunization: Secondary | ICD-10-CM

## 2019-06-16 NOTE — Progress Notes (Signed)
   Covid-19 Vaccination Clinic  Name:  Savannah Griffin    MRN: [id number] DOB: 1950-05-06  06/16/2019  Savannah Griffin was observed post Covid-19 immunization for 15 minutes without incidence. She was provided with Vaccine Information Sheet and instruction to access the V-Safe system.   Savannah Griffin was instructed to call 911 with any severe reactions post vaccine: Marland Kitchen Difficulty breathing  . Swelling of your face and throat  . A fast heartbeat  . A bad rash all over your body  . Dizziness and weakness    Immunizations Administered    Name Date Dose VIS Date Route   Pfizer COVID-19 Vaccine 06/16/2019  8:10 AM 0.3 mL 04/06/2019 Intramuscular   Manufacturer: [place]   Lot: [id number]   [place]: [id number]

## 2019-07-09 DIAGNOSIS — Z1231 Encounter for screening mammogram for malignant neoplasm of breast: Secondary | ICD-10-CM | POA: Diagnosis not present

## 2019-07-10 ENCOUNTER — Ambulatory Visit: Payer: Medicare Other | Attending: Internal Medicine

## 2019-07-10 ENCOUNTER — Other Ambulatory Visit: Payer: Self-pay

## 2019-07-10 DIAGNOSIS — Z23 Encounter for immunization: Secondary | ICD-10-CM

## 2019-07-10 NOTE — Progress Notes (Signed)
   Covid-19 Vaccination Clinic  Name:  NIYATI AMBLE    MRN: [id number] DOB: [birth year]  07/10/2019  Ms. Blitch was observed post Covid-19 immunization for 15 minutes without incident. She was provided with Vaccine Information Sheet and instruction to access the V-Safe system.   Ms. Mongan was instructed to call 911 with any severe reactions post vaccine: Marland Kitchen Difficulty breathing  . Swelling of face and throat  . A fast heartbeat  . A bad rash all over body  . Dizziness and weakness   Immunizations Administered    Name Date Dose VIS Date Route   Pfizer COVID-19 Vaccine 07/10/2019  8:10 AM 0.3 mL 04/06/2019 Intramuscular   Manufacturer: [place]   Lot: [id number]   [place]: [id number]

## 2019-11-30 DIAGNOSIS — Z961 Presence of intraocular lens: Secondary | ICD-10-CM | POA: Diagnosis not present

## 2020-01-22 DIAGNOSIS — Z23 Encounter for immunization: Secondary | ICD-10-CM | POA: Diagnosis not present

## 2020-01-29 DIAGNOSIS — H9202 Otalgia, left ear: Secondary | ICD-10-CM | POA: Diagnosis not present

## 2020-02-01 DIAGNOSIS — Z23 Encounter for immunization: Secondary | ICD-10-CM | POA: Diagnosis not present

## 2020-02-28 DIAGNOSIS — D2272 Melanocytic nevi of left lower limb, including hip: Secondary | ICD-10-CM | POA: Diagnosis not present

## 2020-02-28 DIAGNOSIS — L821 Other seborrheic keratosis: Secondary | ICD-10-CM | POA: Diagnosis not present

## 2020-02-28 DIAGNOSIS — D2271 Melanocytic nevi of right lower limb, including hip: Secondary | ICD-10-CM | POA: Diagnosis not present

## 2020-02-28 DIAGNOSIS — D2261 Melanocytic nevi of right upper limb, including shoulder: Secondary | ICD-10-CM | POA: Diagnosis not present

## 2020-02-28 DIAGNOSIS — D225 Melanocytic nevi of trunk: Secondary | ICD-10-CM | POA: Diagnosis not present

## 2020-02-28 DIAGNOSIS — X32XXXA Exposure to sunlight, initial encounter: Secondary | ICD-10-CM | POA: Diagnosis not present

## 2020-02-28 DIAGNOSIS — D2262 Melanocytic nevi of left upper limb, including shoulder: Secondary | ICD-10-CM | POA: Diagnosis not present

## 2020-02-28 DIAGNOSIS — L570 Actinic keratosis: Secondary | ICD-10-CM | POA: Diagnosis not present

## 2021-01-12 IMAGING — US US EXTREM UP *R* LTD
1 series · 14 of 15 positions shown · non-contrast
Comparison: NONE

CLINICAL DATA: Mass and pain and tenderness at the tip of the right
thumb.

EXAM:
ULTRASOUND RIGHT UPPER EXTREMITY LIMITED
TECHNIQUE: Ultrasound examination of the upper extremity soft tissues was
performed in the area of clinical concern.

[Series 1: us extrem up *right* ltd · 0.05mm/px · 15 acquisitions, 14 frames shown]
[im 1/15]
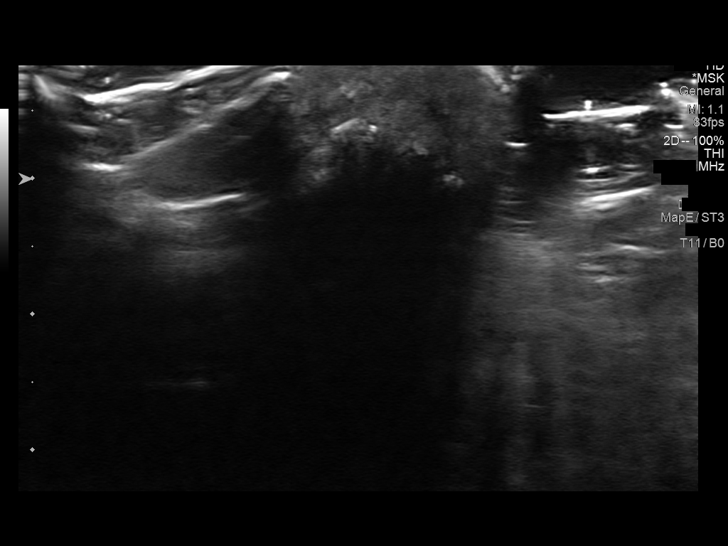
[im 2/15]
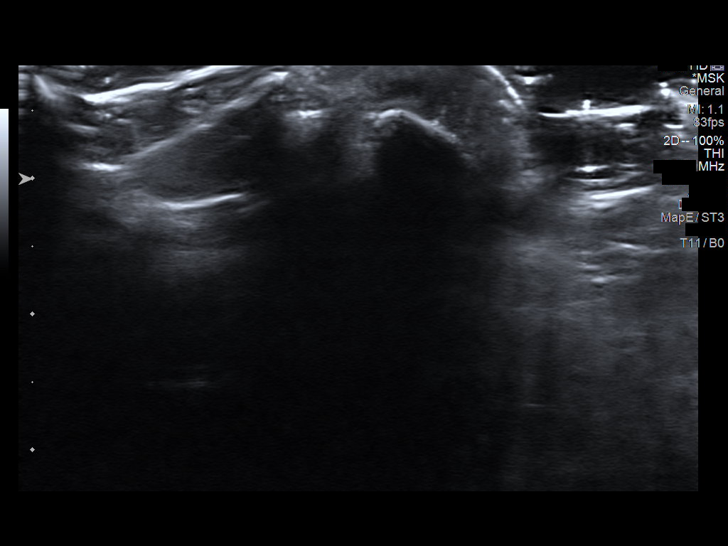
[im 3/15]
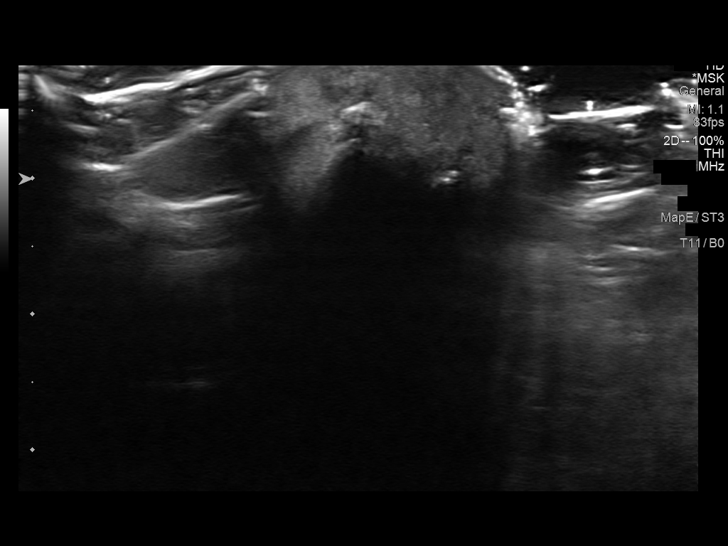
[im 4/15]
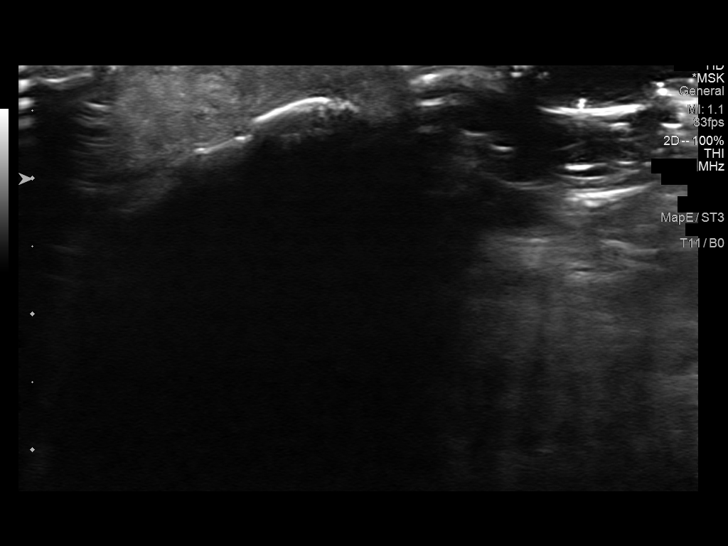
[im 5/15]
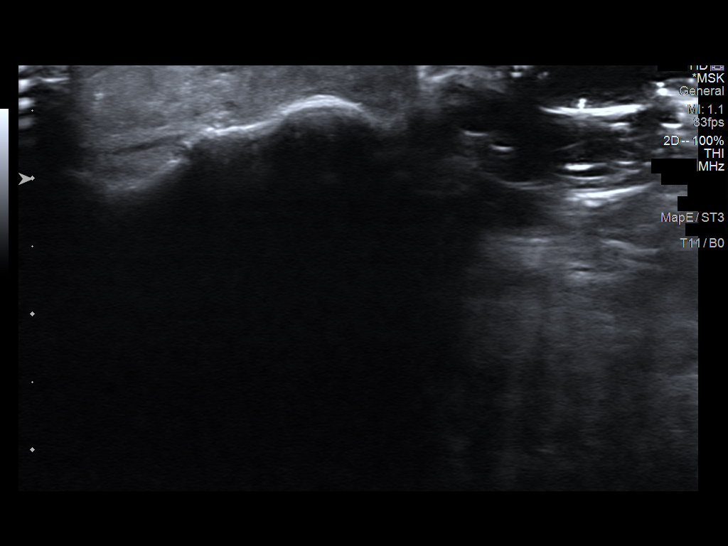
[im 6/15]
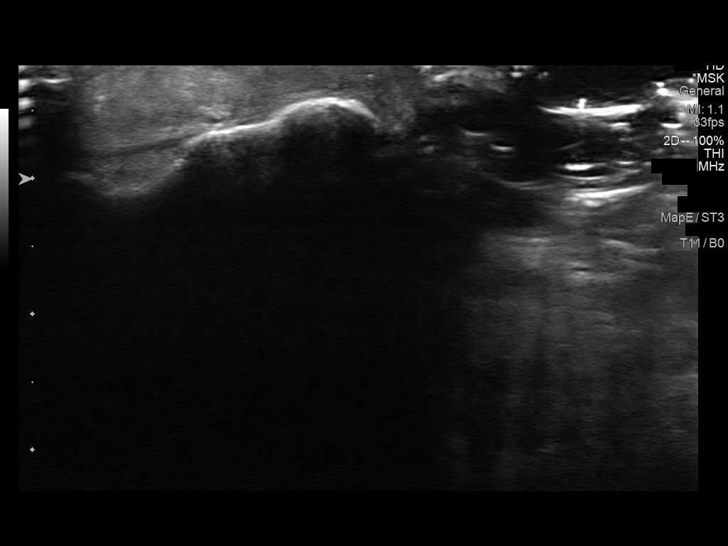
[im 7/15]
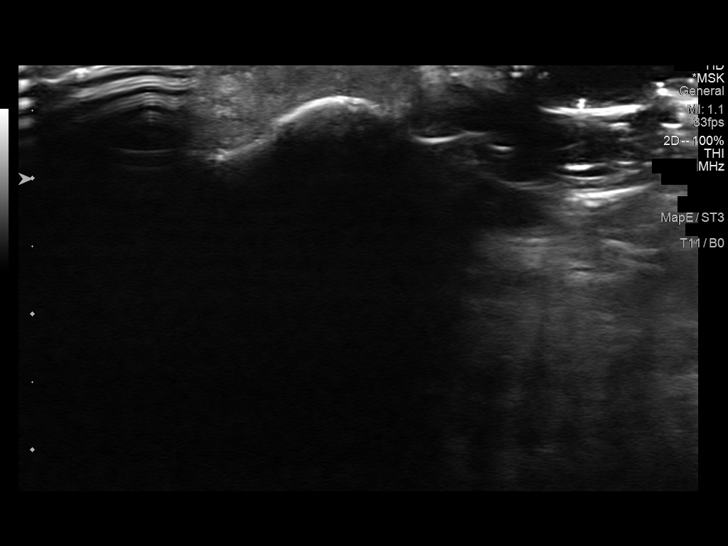
[im 9/15]
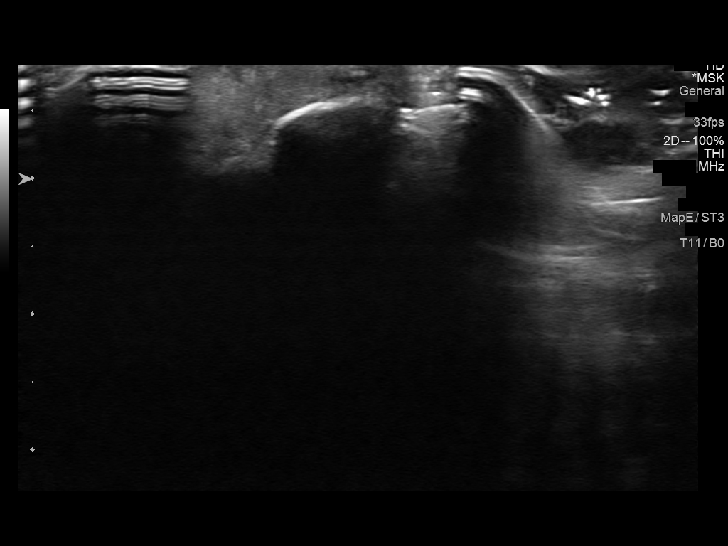
[im 10/15]
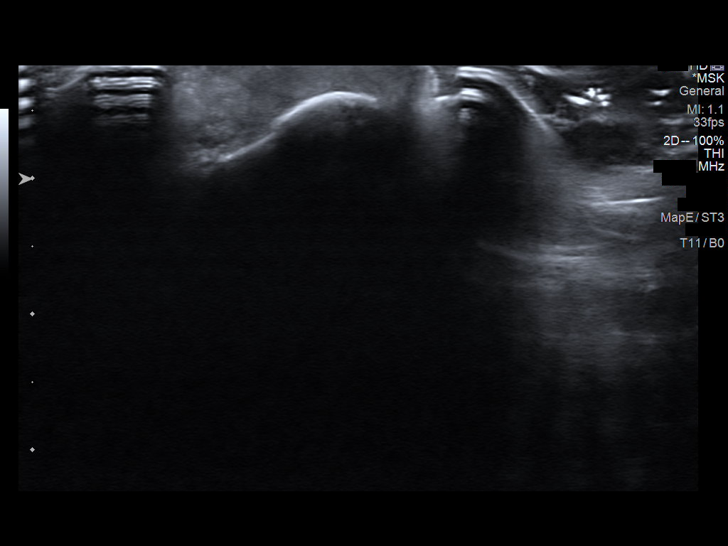
[im 11/15]
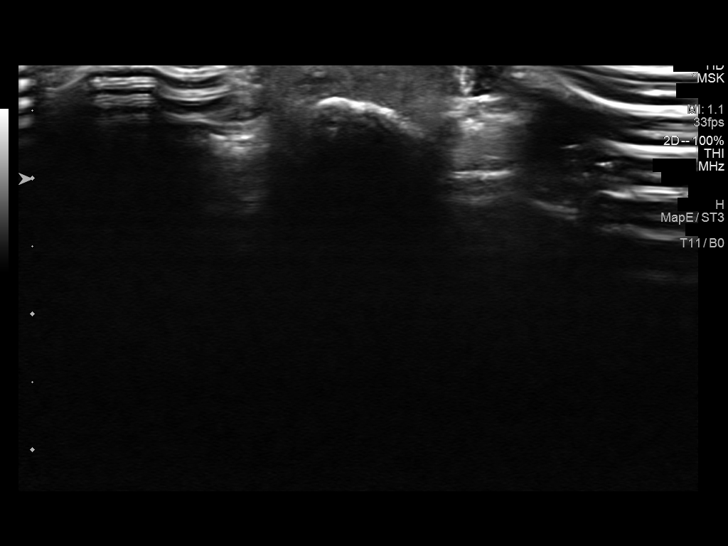
[im 12/15]
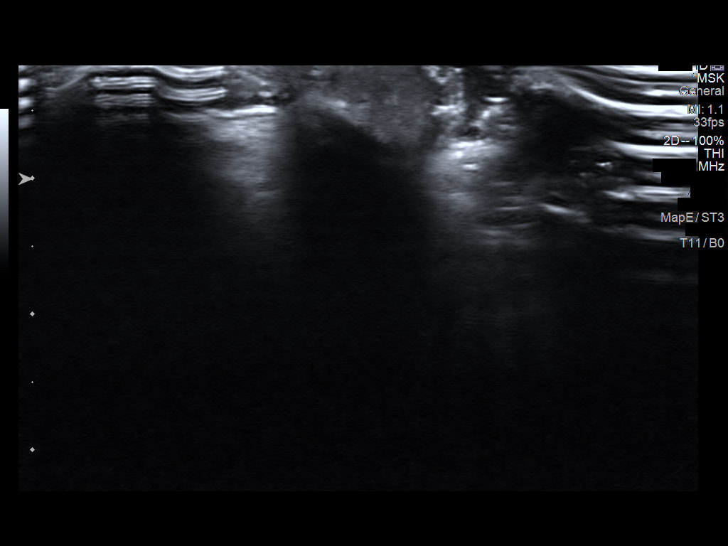
[im 13/15]
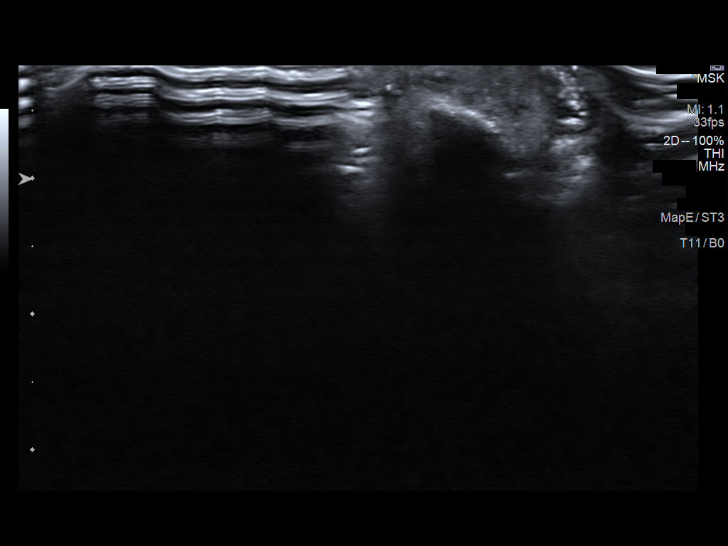
[im 14/15]
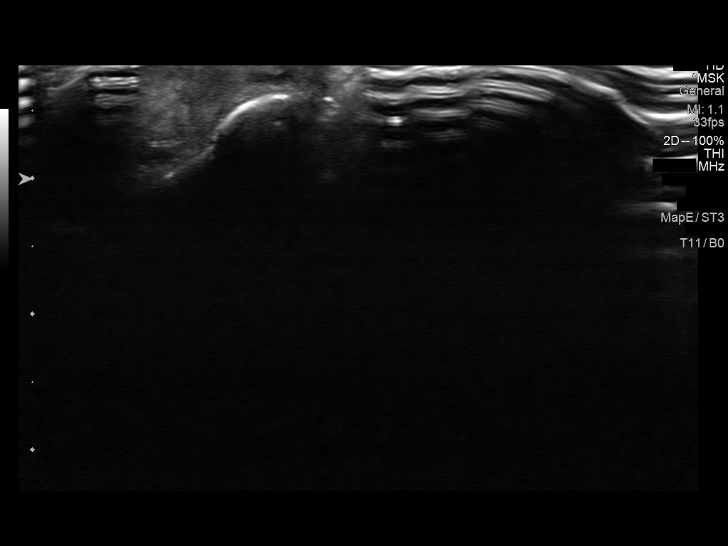
[im 15/15]
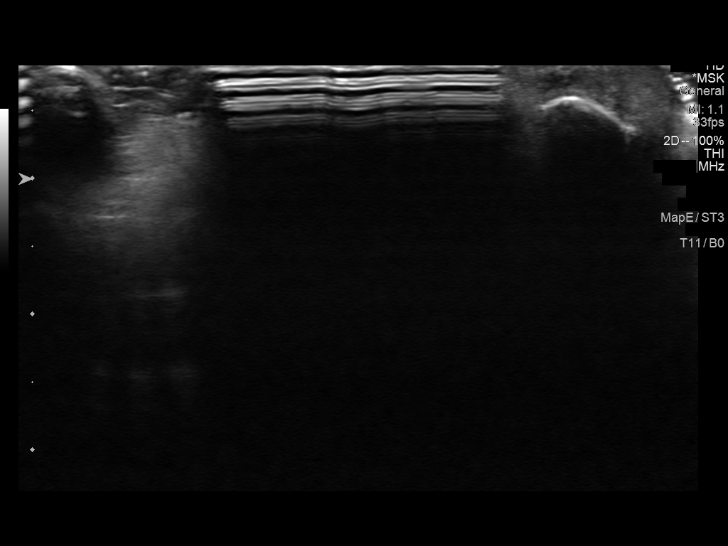

[14 of 15 positions shown; findings below may reference images not displayed]

FINDINGS: There is a subtle 4 mm area of inhomogeneous low-density in the fat
pad of the tip of the thumb at the area of concern. There is a tiny
echogenic area within the lesion without posterior acoustical
shadowing. I doubt that this represents a foreign body.

No other abnormalities.
IMPRESSION: Poorly defined 4 mm lesion in the fat pad of the tip of the thumb. I
suspect this represents a subcutaneous wart.

## 2021-06-08 DIAGNOSIS — Z0000 Encounter for general adult medical examination without abnormal findings: Secondary | ICD-10-CM | POA: Diagnosis not present

## 2021-06-08 DIAGNOSIS — R7309 Other abnormal glucose: Secondary | ICD-10-CM | POA: Diagnosis not present

## 2021-06-08 DIAGNOSIS — F411 Generalized anxiety disorder: Secondary | ICD-10-CM | POA: Diagnosis not present

## 2021-06-15 DIAGNOSIS — Z0000 Encounter for general adult medical examination without abnormal findings: Secondary | ICD-10-CM | POA: Diagnosis not present

## 2021-06-15 DIAGNOSIS — F419 Anxiety disorder, unspecified: Secondary | ICD-10-CM | POA: Diagnosis not present

## 2021-06-15 DIAGNOSIS — C50919 Malignant neoplasm of unspecified site of unspecified female breast: Secondary | ICD-10-CM | POA: Diagnosis not present

## 2021-08-17 DIAGNOSIS — Z1231 Encounter for screening mammogram for malignant neoplasm of breast: Secondary | ICD-10-CM | POA: Diagnosis not present

## 2022-01-11 DIAGNOSIS — H52223 Regular astigmatism, bilateral: Secondary | ICD-10-CM | POA: Diagnosis not present

## 2022-01-19 DIAGNOSIS — H524 Presbyopia: Secondary | ICD-10-CM | POA: Diagnosis not present

## 2022-03-02 DIAGNOSIS — L538 Other specified erythematous conditions: Secondary | ICD-10-CM | POA: Diagnosis not present

## 2022-03-02 DIAGNOSIS — D2261 Melanocytic nevi of right upper limb, including shoulder: Secondary | ICD-10-CM | POA: Diagnosis not present

## 2022-03-02 DIAGNOSIS — L821 Other seborrheic keratosis: Secondary | ICD-10-CM | POA: Diagnosis not present

## 2022-03-02 DIAGNOSIS — D2272 Melanocytic nevi of left lower limb, including hip: Secondary | ICD-10-CM | POA: Diagnosis not present

## 2022-03-02 DIAGNOSIS — D2271 Melanocytic nevi of right lower limb, including hip: Secondary | ICD-10-CM | POA: Diagnosis not present

## 2022-03-02 DIAGNOSIS — D225 Melanocytic nevi of trunk: Secondary | ICD-10-CM | POA: Diagnosis not present

## 2022-03-02 DIAGNOSIS — L820 Inflamed seborrheic keratosis: Secondary | ICD-10-CM | POA: Diagnosis not present

## 2022-03-02 DIAGNOSIS — D2262 Melanocytic nevi of left upper limb, including shoulder: Secondary | ICD-10-CM | POA: Diagnosis not present

## 2022-03-27 DIAGNOSIS — K649 Unspecified hemorrhoids: Secondary | ICD-10-CM | POA: Diagnosis not present

## 2022-03-27 DIAGNOSIS — R300 Dysuria: Secondary | ICD-10-CM | POA: Diagnosis not present

## 2022-04-13 DIAGNOSIS — H903 Sensorineural hearing loss, bilateral: Secondary | ICD-10-CM | POA: Diagnosis not present

## 2022-04-13 DIAGNOSIS — H6121 Impacted cerumen, right ear: Secondary | ICD-10-CM | POA: Diagnosis not present

## 2022-04-13 DIAGNOSIS — H6981 Other specified disorders of Eustachian tube, right ear: Secondary | ICD-10-CM | POA: Diagnosis not present

## 2022-12-26 ENCOUNTER — Emergency Department: Payer: Medicare HMO

## 2022-12-26 ENCOUNTER — Other Ambulatory Visit: Payer: Self-pay

## 2022-12-26 ENCOUNTER — Encounter: Payer: Self-pay | Admitting: Intensive Care

## 2022-12-26 ENCOUNTER — Emergency Department
Admission: EM | Admit: 2022-12-26 | Discharge: 2022-12-26 | Disposition: A | Discharge status: Home / Self Care | Payer: Medicare HMO | Attending: Student in an Organized Health Care Education/Training Program | Admitting: Student in an Organized Health Care Education/Training Program

## 2022-12-26 DIAGNOSIS — R202 Paresthesia of skin: Secondary | ICD-10-CM | POA: Diagnosis present

## 2022-12-26 DIAGNOSIS — M5412 Radiculopathy, cervical region: Secondary | ICD-10-CM | POA: Insufficient documentation

## 2022-12-26 LAB — CBC WITH DIFFERENTIAL/PLATELET
Abs Immature Granulocytes: 0.02 10*3/uL (ref 0.00–0.07)
Basophils Absolute: 0.1 10*3/uL (ref 0.0–0.1)
Basophils Relative: 1 %
Eosinophils Absolute: 0.1 10*3/uL (ref 0.0–0.5)
Eosinophils Relative: 1 %
HCT: 45.4 % (ref 36.0–46.0)
Hemoglobin: 14.8 g/dL (ref 12.0–15.0)
Immature Granulocytes: 0 %
Lymphocytes Relative: 23 %
Lymphs Abs: 1.3 10*3/uL (ref 0.7–4.0)
MCH: 30.3 pg (ref 26.0–34.0)
MCHC: 32.6 g/dL (ref 30.0–36.0)
MCV: 93 fL (ref 80.0–100.0)
Monocytes Absolute: 0.4 10*3/uL (ref 0.1–1.0)
Monocytes Relative: 7 %
Neutro Abs: 3.8 10*3/uL (ref 1.7–7.7)
Neutrophils Relative %: 68 %
Platelets: 239 10*3/uL (ref 150–400)
RBC: 4.88 MIL/uL (ref 3.87–5.11)
RDW: 11.8 % (ref 11.5–15.5)
WBC: 5.6 10*3/uL (ref 4.0–10.5)
nRBC: 0 % (ref 0.0–0.2)

## 2022-12-26 LAB — COMPREHENSIVE METABOLIC PANEL
ALT: 16 U/L (ref 0–44)
AST: 21 U/L (ref 15–41)
Albumin: 4.2 g/dL (ref 3.5–5.0)
Alkaline Phosphatase: 106 U/L (ref 38–126)
Anion gap: 8 (ref 5–15)
BUN: 13 mg/dL (ref 8–23)
CO2: 27 mmol/L (ref 22–32)
Calcium: 9.1 mg/dL (ref 8.9–10.3)
Chloride: 102 mmol/L (ref 98–111)
Creatinine, Ser: 0.62 mg/dL (ref 0.44–1.00)
GFR, Estimated: >60 mL/min (ref >60)
Glucose, Bld: 112 mg/dL — ABNORMAL HIGH (ref 70–99)
Potassium: 4.3 mmol/L (ref 3.5–5.1)
Sodium: 137 mmol/L (ref 135–145)
Total Bilirubin: 0.5 mg/dL (ref 0.3–1.2)
Total Protein: 6.7 g/dL (ref 6.5–8.1)

## 2022-12-26 LAB — TROPONIN I (HIGH SENSITIVITY): Troponin I (High Sensitivity): 5 ng/L (ref <18)

## 2022-12-26 MED ORDER — METHYLPREDNISOLONE 4 MG PO TBPK: ORAL_TABLET | ORAL | 0 refills | Status: DC

## 2022-12-26 MED ORDER — LORAZEPAM 1 MG PO TABS
0.5000 mg | ORAL_TABLET | Freq: Once | ORAL | Status: AC
  Administered 2022-12-26: 0.5 mg via ORAL
  Filled 2022-12-26: qty 1

## 2022-12-26 NOTE — ED Triage Notes (Signed)
Patient c/o bilateral tingling arms X1 week. Reports low energy. Denies pain

## 2022-12-26 NOTE — ED Provider Notes (Signed)
[facility] Provider Note    Event Date/Time   First MD Initiated Contact with Patient 12/26/22 1019     (approximate)   History   Numbness (Bilateral arms)   HPI  Savannah Griffin is a 72 y.o. female who presents to the ER for evaluation of several weeks of progressively worsening bilateral upper extremity numbness and tingling.  Denies any other associated symptoms numbness or weakness.  No history of stroke.  Denies any fevers has had some chest discomfort associated with that.  Has been stressed out with family members being ill recently.     Physical Exam   Triage Vital Signs: ED Triage Vitals [12/26/22 0939]  Encounter Vitals Group     BP (!) 156/73     Systolic BP Percentile      Diastolic BP Percentile      Pulse Rate 99     Resp 15     Temp 98.5 F (36.9 C)     Temp Source Oral     SpO2 100 %     Weight 142 lb (64.4 kg)     Height 5\' 2"  (1.575 m)     Head Circumference      Peak Flow      Pain Score 0     Pain Loc      Pain Education      Exclude from Growth Chart     Most recent vital signs: Vitals:   12/26/22 0939 12/26/22 1344  BP: (!) 156/73 (!) 150/70  Pulse: 99 90  Resp: 15 16  Temp: 98.5 F (36.9 C) 98 F (36.7 C)  SpO2: 100% 98%     Constitutional: Alert  Eyes: Conjunctivae are normal.  Head: Atraumatic. Nose: No congestion/rhinnorhea. Mouth/Throat: Mucous membranes are moist.   Neck: Painless ROM.  Cardiovascular:   Good peripheral circulation. Respiratory: Normal respiratory effort.  No retractions.  Gastrointestinal: Soft and nontender.  Musculoskeletal:  no deformity Neurologic:  MAE spontaneously. No gross focal neurologic deficits are appreciated.   Sensation intact.  Good strength throughout.   Skin:  Skin is warm, dry and intact. No rash noted. Psychiatric: Mood and affect are normal. Speech and behavior are normal.    ED Results / Procedures / Treatments   Labs (all labs ordered are listed, but  only abnormal results are displayed) Labs Reviewed  COMPREHENSIVE METABOLIC PANEL - Abnormal; Notable for the following components:      Result Value   Glucose, Bld 112 (*)    All other components within normal limits  CBC WITH DIFFERENTIAL/PLATELET  TROPONIN I (HIGH SENSITIVITY)     EKG  ED ECG REPORT I, Willy Eddy, the attending physician, personally viewed and interpreted this ECG.   Date: 12/26/2022  EKG Time: 10:34  Rate: 75  Rhythm: sinus  Axis: normal  Intervals: normal  ST&T Change: no stemi, no depression    RADIOLOGY Please see ED Course for my review and interpretation.  I personally reviewed all radiographic images ordered to evaluate for the above acute complaints and reviewed radiology reports and findings.  These findings were personally discussed with the patient.  Please see medical record for radiology report.    PROCEDURES:  Critical Care performed: No  Procedures   MEDICATIONS ORDERED IN ED: Medications  LORazepam (ATIVAN) tablet 0.5 mg (0.5 mg Oral Given 12/26/22 1326)     IMPRESSION / MDM / ASSESSMENT AND PLAN / ED COURSE  I reviewed the triage vital signs and the  nursing notes.                              Differential diagnosis includes, but is not limited to, radiculopathy, ACS, stress, electrolyte abnormality, CVA, mass  Patient presenting to the ER for evaluation of symptoms as described above.  Based on symptoms, risk factors and considered above differential, this presenting complaint could reflect a potentially life-threatening illness therefore the patient will be placed on continuous pulse oximetry and telemetry for monitoring.  Laboratory evaluation will be sent to evaluate for the above complaints.      Clinical Course as of 12/26/22 1548  Sun Dec 26, 2022  1106 CT head on my review and interpretation without evidence of bleed or mass. 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 [PR]  1548 MRI with evidence of some stenosis without cord or management and there is evidence of radiculopathy.  Patient does appear stable and appropriate for outpatient follow-up.  Has been given referral to the neurosurgery.  We discussed strict return precautions.  She is agreeable with plan. [PR]    Clinical Course User Index [PR] Willy Eddy, MD     FINAL CLINICAL IMPRESSION(S) / ED DIAGNOSES   Final diagnoses:  Cervical radiculopathy     Rx / DC Orders   ED Discharge Orders          Ordered    methylPREDNISolone (MEDROL DOSEPAK) 4 MG TBPK tablet        12/26/22 1545             Note:  This document was prepared using Dragon voice recognition software and may include unintentional dictation errors.    Willy Eddy, MD 12/26/22 [id number]

## 2022-12-26 NOTE — ED Notes (Signed)
See triage notes. Patient stated she hasn't felt well for about two weeks. Stated her arms are tingly and sometimes his mouth is also

## 2023-01-04 NOTE — Progress Notes (Unsigned)
Referring Physician:  Willy Eddy, MD [address] [place],  [place] [address]  Primary Physician:  Debbra Riding [facility], PA-C  History of Present Illness: 01/04/2023 [name] is here today with a chief complaint of cervical stenosis.  She was recently seen in the emergency department and had a battery of testing which included the cervical spine MRI.  It showed some multilevel stenosis so referral was made to neurosurgery.  She does get some intermittent upper extremity numbness and tingling, and she has dropped things intermittently.  However she has very limited neck pain and limited upper extremity pain.  She does feel better with heating pads.  Not having any difficulty with walking.  She just feels like her bilateral arms will intermittently fall asleep but improved back to baseline.   Conservative measures:  Physical therapy: no Multimodal medical therapy including regular antiinflammatories: Prednisone, Tylenol, Celebrex  Injections:  epidural steroid injections   Past Surgery: no  I have utilized the care everywhere function in epic to review the outside records available from external health systems.  Review of Systems:  A 10 point review of systems is negative, except for the pertinent positives and negatives detailed in the HPI.  Past Medical History: Past Medical History:  Diagnosis Date   Arthritis    Cancer (HCC)    BREAST   Complication of anesthesia    NO RECENT N/V   Finger mass, right    thumb   GERD (gastroesophageal reflux disease)    PONV (postoperative nausea and vomiting)    H/O  NONE WITH RECENT SURGIES    Past Surgical History: Past Surgical History:  Procedure Laterality Date   ABDOMINAL HYSTERECTOMY     BREAST SURGERY     BX/ LUMPECTOMY with sentinel lymph biopsy 2001   CARPAL TUNNEL RELEASE     both hands   CATARACT EXTRACTION W/PHACO Left 10/26/2016   Procedure: CATARACT EXTRACTION PHACO AND INTRAOCULAR LENS PLACEMENT  (IOC);  Surgeon: Galen Manila, MD;  Location: [facility] ORS;  Service: Ophthalmology;  Laterality: Left;  [place] 00:43 AP% 12.0 CDE 5.20 Fluid pack lot # [id number]   CATARACT EXTRACTION W/PHACO Right 11/16/2016   Procedure: CATARACT EXTRACTION PHACO AND INTRAOCULAR LENS PLACEMENT (IOC);  Surgeon: Galen Manila, MD;  Location: [facility] ORS;  Service: Ophthalmology;  Laterality: Right;  [place] 00:45.9 AP% 13.4 CDE 6.16 Fluid Pack lot # [id number]   COLONOSCOPY     CTR     DILATION AND CURETTAGE OF UTERUS     needle core biopsy     TONSILLECTOMY     TUBAL LIGATION      Allergies: Allergies as of 01/05/2023 - Review Complete 12/26/2022  Allergen Reaction Noted   Augmentin [amoxicillin-pot clavulanate]  10/21/2016   Avelox [moxifloxacin]  12/26/2022   Biaxin [clarithromycin]  10/21/2016   Cefdinir  11/10/2016   Clindamycin/lincomycin  10/21/2016   Doxycycline  12/26/2022   Macrodantin [nitrofurantoin macrocrystal]  10/21/2016   Septra [sulfamethoxazole-trimethoprim]  10/21/2016    Medications:  Current Outpatient Medications:    acetaminophen (TYLENOL) 500 MG tablet, Take 500-1,000 mg by mouth every 8 (eight) hours as needed for mild pain or moderate pain., Disp: , Rfl:    diclofenac sodium (VOLTAREN) 1 % GEL, Apply topically 4 (four) times daily., Disp: , Rfl:    LORazepam (ATIVAN) 0.5 MG tablet, Take 0.5 mg by mouth every 8 (eight) hours as needed for anxiety., Disp: , Rfl:    methylPREDNISolone (MEDROL DOSEPAK) 4 MG TBPK tablet, 1  dosepak, Disp: 1 each, Rfl: 0   Olopatadine HCl (PATANASE) 0.6 % SOLN, Place 1 spray into both nostrils daily as needed (allergies prn). , Disp: , Rfl:   Social History: Social History   Tobacco Use   Smoking status: Never   Smokeless tobacco: Never  Vaping Use   Vaping status: Never Used  Substance Use Topics   Alcohol use: No   Drug use: Never    Family Medical History: No family history on file.  Physical Examination: There were no vitals filed  for this visit.  General: Patient is in no apparent distress. Attention to examination is appropriate.  Neck:   Supple.  Full range of motion.  Respiratory: Patient is breathing without any difficulty.   NEUROLOGICAL:     Awake, alert, oriented to person, place, and time.  Speech is clear and fluent.   Cranial Nerves: Pupils equal round and reactive to light.  Facial tone is symmetric.  Facial sensation is symmetric. Shoulder shrug is symmetric. Tongue protrusion is midline.    Strength: Side Biceps Triceps Deltoid Interossei Grip Wrist Ext. Wrist Flex.  R 5 5 5 5 5 5 5   L 5 5 5 5 5 5 5     Reflexes are 3+ and symmetric at the biceps, triceps, brachioradialis, patella and achilles.   Hoffman's is present on the left side clonus is absent  Bilateral upper and lower extremity sensation is intact to light touch .     No evidence of dysmetria noted.  Gait is normal.    Imaging: Narrative & Impression  CLINICAL DATA:  Myelopathy, acute, cervical spine. Bilateral arm tingling for 1 week with decreased energy.   EXAM: MRI CERVICAL SPINE WITHOUT CONTRAST   TECHNIQUE: Multiplanar, multisequence MR imaging of the cervical spine was performed. No intravenous contrast was administered.   COMPARISON:  Cervical spine CT 12/26/2022.   FINDINGS: Alignment: Physiologic.   Vertebrae: No acute or suspicious osseous findings.   Cord: Normal in signal and caliber.   Posterior Fossa, vertebral arteries, paraspinal tissues: Intracranial findings are dictated separately.Bilateral vertebral artery flow voids. No significant paraspinal findings.   Disc levels:   C2-3: Normal interspace.   C3-4: Loss of disc height with disc bulging and bilateral uncinate spurring. No central spinal stenosis. Moderate right and mild-to-moderate left foraminal narrowing.   C4-5: Loss of disc height with disc bulging and bilateral uncinate spurring. Mild spinal stenosis without cord deformity.  Moderate osseous foraminal narrowing bilaterally.   C5-6: Loss of disc height with asymmetric uncinate spurring on the right. No spinal stenosis. Moderate right and mild left foraminal narrowing.   C6-7: Loss of disc height with disc bulging and bilateral uncinate spurring. No significant central spinal stenosis. Moderate osseous foraminal narrowing bilaterally.   C7-T1: Bilateral facet hypertrophy. No significant spinal stenosis or foraminal narrowing.   IMPRESSION: 1. No acute findings. No evidence of cord compression or abnormal cord signal. 2. 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 3. Multilevel osseous foraminal narrowing as described, greatest bilaterally at C4-5 and C6-7 and on the right at C3-4 and C5-6. The foraminal narrowing may contribute to the patient's radicular symptoms.     Electronically Signed   By: Carey Bullocks [name]   On: 12/26/2022 15:10    I have personally reviewed the images and agree with the above interpretation.  Medical Decision Making/Assessment and Plan: Ms. Bowhay is a pleasant 72 y.o. female with a recent admission into the emergency department for some diffuse symptoms.  She had a cervical MRI as part of her workup and was found to have multilevel spondylosis with some stenosis.  On history, she does have some intermittent hand tingling and mild neck pain but states that this is somewhat minor and a surprise to her on imaging.  On physical examination she does have some mild hyperreflexia throughout with a Hoffmann's on the left.  On strength exam she has full strength with no gross deficits or no muscle wasting.  She does have some mild neck pain that is positional in nature.  Given her hyperreflexia and mild stenosis on MRI we would like to get a flexion-extension film to make sure she is not having any dynamic compression of her cervical spine.  Would like to follow-up with her after the x-rays to go over the  results.  Will plan for a phone visit.  Thank you for involving me in the care of this patient.    Lovenia Kim MD/MSCR Neurosurgery

## 2023-01-05 ENCOUNTER — Encounter: Payer: Self-pay | Admitting: Neurosurgery

## 2023-01-05 ENCOUNTER — Ambulatory Visit
Admission: RE | Admit: 2023-01-05 | Discharge: 2023-01-05 | Disposition: A | Discharge status: Home / Self Care | Payer: Medicare HMO | Source: Ambulatory Visit | Attending: Neurosurgery | Admitting: Neurosurgery

## 2023-01-05 ENCOUNTER — Ambulatory Visit
Admission: RE | Admit: 2023-01-05 | Discharge: 2023-01-05 | Disposition: A | Discharge status: Home / Self Care | Payer: Medicare HMO | Attending: Neurosurgery | Admitting: Neurosurgery

## 2023-01-05 ENCOUNTER — Ambulatory Visit: Payer: Medicare HMO | Admitting: Neurosurgery

## 2023-01-05 VITALS — BP 122/70 | Ht 62.0 in | Wt 146.0 lb

## 2023-01-05 DIAGNOSIS — M47812 Spondylosis without myelopathy or radiculopathy, cervical region: Secondary | ICD-10-CM | POA: Diagnosis present

## 2023-01-05 DIAGNOSIS — M4802 Spinal stenosis, cervical region: Secondary | ICD-10-CM | POA: Diagnosis not present

## 2023-01-19 ENCOUNTER — Ambulatory Visit (INDEPENDENT_AMBULATORY_CARE_PROVIDER_SITE_OTHER): Payer: Medicare HMO | Admitting: Neurosurgery

## 2023-01-19 DIAGNOSIS — M47812 Spondylosis without myelopathy or radiculopathy, cervical region: Secondary | ICD-10-CM | POA: Diagnosis not present

## 2023-01-19 NOTE — Progress Notes (Signed)
I had a follow-up phone call today with [name].  She was at home and I was in the office.  She gave consent to go forward with a phone call.  We discussed her cervical x-rays of flexion-extension which did not show any changes in dynamic instability.  Overall these findings were reassuring.  She is not having any new symptoms.  She is currently dealing with a significant sinus infection and being evaluated and treated by ear nose and throat specialist later this week.  She has not noticed any new numbness weakness or tingling.  She is currently being treated for low B12 and getting weekly supplementation injections.  This could be contributing to some of her neuropathy or neurologic based symptoms.  She would like to follow-up with [place] as needed.  She has her contact information and was told the red flag signs and symptoms to return to clinic for.  Narrative & Impression  CLINICAL DATA:  Neck pain numbness down both arms for 3 weeks. No injury.   EXAM: CERVICAL SPINE - COMPLETE 4+ VIEW   COMPARISON:  None Available.   FINDINGS: Normal vertebral body stature alignment.   Mild loss of disc height with endplate spurring from C3-C4 through C6-C7.   No subluxation with flexion or extension.   Soft tissues are unremarkable.   IMPRESSION: 1. No fracture or acute finding. No malalignment and no subluxation with flexion or extension. 2. Mild disc degenerative changes.     Electronically Signed   By: Amie Portland [name]   On: 01/17/2023 14:02    We spent 10 minutes on this phone call discussing her care

## 2023-04-18 ENCOUNTER — Other Ambulatory Visit: Payer: Self-pay

## 2023-04-18 ENCOUNTER — Emergency Department: Payer: Medicare HMO

## 2023-04-18 ENCOUNTER — Observation Stay
Admission: EM | Admit: 2023-04-18 | Discharge: 2023-04-19 | Disposition: A | Discharge status: Home / Self Care | Payer: Medicare HMO | Attending: Internal Medicine | Admitting: Internal Medicine

## 2023-04-18 DIAGNOSIS — Z853 Personal history of malignant neoplasm of breast: Secondary | ICD-10-CM | POA: Diagnosis not present

## 2023-04-18 DIAGNOSIS — R112 Nausea with vomiting, unspecified: Secondary | ICD-10-CM | POA: Diagnosis present

## 2023-04-18 DIAGNOSIS — R110 Nausea: Secondary | ICD-10-CM

## 2023-04-18 DIAGNOSIS — N390 Urinary tract infection, site not specified: Secondary | ICD-10-CM | POA: Diagnosis not present

## 2023-04-18 DIAGNOSIS — R1084 Generalized abdominal pain: Secondary | ICD-10-CM | POA: Diagnosis not present

## 2023-04-18 DIAGNOSIS — F419 Anxiety disorder, unspecified: Secondary | ICD-10-CM | POA: Insufficient documentation

## 2023-04-18 DIAGNOSIS — E538 Deficiency of other specified B group vitamins: Secondary | ICD-10-CM | POA: Insufficient documentation

## 2023-04-18 DIAGNOSIS — K859 Acute pancreatitis without necrosis or infection, unspecified: Principal | ICD-10-CM | POA: Insufficient documentation

## 2023-04-18 DIAGNOSIS — K8500 Idiopathic acute pancreatitis without necrosis or infection: Secondary | ICD-10-CM

## 2023-04-18 DIAGNOSIS — Z9889 Other specified postprocedural states: Secondary | ICD-10-CM | POA: Insufficient documentation

## 2023-04-18 LAB — CBC WITH DIFFERENTIAL/PLATELET
Abs Immature Granulocytes: 0.05 10*3/uL (ref 0.00–0.07)
Basophils Absolute: 0 10*3/uL (ref 0.0–0.1)
Basophils Relative: 0 %
Eosinophils Absolute: 0 10*3/uL (ref 0.0–0.5)
Eosinophils Relative: 0 %
HCT: 43.2 % (ref 36.0–46.0)
Hemoglobin: 14.5 g/dL (ref 12.0–15.0)
Immature Granulocytes: 1 %
Lymphocytes Relative: 4 %
Lymphs Abs: 0.5 10*3/uL — ABNORMAL LOW (ref 0.7–4.0)
MCH: 30.3 pg (ref 26.0–34.0)
MCHC: 33.6 g/dL (ref 30.0–36.0)
MCV: 90.2 fL (ref 80.0–100.0)
Monocytes Absolute: 0.4 10*3/uL (ref 0.1–1.0)
Monocytes Relative: 4 %
Neutro Abs: 9.7 10*3/uL — ABNORMAL HIGH (ref 1.7–7.7)
Neutrophils Relative %: 91 %
Platelets: 203 10*3/uL (ref 150–400)
RBC: 4.79 MIL/uL (ref 3.87–5.11)
RDW: 11.5 % (ref 11.5–15.5)
WBC: 10.6 10*3/uL — ABNORMAL HIGH (ref 4.0–10.5)
nRBC: 0 % (ref 0.0–0.2)

## 2023-04-18 LAB — LIPID PANEL
Cholesterol: 209 mg/dL — ABNORMAL HIGH (ref 0–200)
HDL: 54 mg/dL (ref >40)
LDL Cholesterol: 141 mg/dL — ABNORMAL HIGH (ref 0–99)
Total CHOL/HDL Ratio: 3.9 {ratio}
Triglycerides: 68 mg/dL (ref <150)
VLDL: 14 mg/dL (ref 0–40)

## 2023-04-18 LAB — LIPASE, BLOOD: Lipase: 678 U/L — ABNORMAL HIGH (ref 11–51)

## 2023-04-18 LAB — URINALYSIS, ROUTINE W REFLEX MICROSCOPIC
Bilirubin Urine: NEGATIVE
Glucose, UA: NEGATIVE mg/dL
Ketones, ur: 20 mg/dL — AB
Nitrite: NEGATIVE
Protein, ur: NEGATIVE mg/dL
Specific Gravity, Urine: 1.01 (ref 1.005–1.030)
pH: 7 (ref 5.0–8.0)

## 2023-04-18 LAB — COMPREHENSIVE METABOLIC PANEL
ALT: 21 U/L (ref 0–44)
AST: 22 U/L (ref 15–41)
Albumin: 4.1 g/dL (ref 3.5–5.0)
Alkaline Phosphatase: 100 U/L (ref 38–126)
Anion gap: 9 (ref 5–15)
BUN: 16 mg/dL (ref 8–23)
CO2: 25 mmol/L (ref 22–32)
Calcium: 9.1 mg/dL (ref 8.9–10.3)
Chloride: 103 mmol/L (ref 98–111)
Creatinine, Ser: 0.59 mg/dL (ref 0.44–1.00)
GFR, Estimated: >60 mL/min (ref >60)
Glucose, Bld: 130 mg/dL — ABNORMAL HIGH (ref 70–99)
Potassium: 3.6 mmol/L (ref 3.5–5.1)
Sodium: 137 mmol/L (ref 135–145)
Total Bilirubin: 0.8 mg/dL (ref <1.2)
Total Protein: 6.7 g/dL (ref 6.5–8.1)

## 2023-04-18 LAB — TROPONIN I (HIGH SENSITIVITY): Troponin I (High Sensitivity): 16 ng/L (ref <18)

## 2023-04-18 MED ORDER — ONDANSETRON HCL 4 MG PO TABS: 4.0000 mg | ORAL_TABLET | Freq: Four times a day (QID) | ORAL | Status: DC | PRN

## 2023-04-18 MED ORDER — OLOPATADINE HCL 0.6 % NA SOLN: 1.0000 | Freq: Every day | NASAL | Status: DC | PRN

## 2023-04-18 MED ORDER — ONDANSETRON HCL 4 MG/2ML IJ SOLN
4.0000 mg | Freq: Once | INTRAMUSCULAR | Status: AC
  Administered 2023-04-18: 4 mg via INTRAVENOUS
  Filled 2023-04-18: qty 2

## 2023-04-18 MED ORDER — MORPHINE SULFATE (PF) 2 MG/ML IV SOLN
2.0000 mg | INTRAVENOUS | Status: DC | PRN
  Administered 2023-04-18 – 2023-04-19 (×3): 2 mg via INTRAVENOUS
  Filled 2023-04-18 (×3): qty 1

## 2023-04-18 MED ORDER — MORPHINE SULFATE (PF) 2 MG/ML IV SOLN
2.0000 mg | Freq: Once | INTRAVENOUS | Status: AC
  Administered 2023-04-18: 2 mg via INTRAVENOUS
  Filled 2023-04-18: qty 1

## 2023-04-18 MED ORDER — SODIUM CHLORIDE 0.9 % IV BOLUS
500.0000 mL | Freq: Once | INTRAVENOUS | Status: AC
  Administered 2023-04-18: 500 mL via INTRAVENOUS

## 2023-04-18 MED ORDER — ACETAMINOPHEN 500 MG PO TABS
500.0000 mg | ORAL_TABLET | Freq: Three times a day (TID) | ORAL | Status: DC | PRN
  Administered 2023-04-18 – 2023-04-19 (×3): 1000 mg via ORAL
  Filled 2023-04-18 (×3): qty 2

## 2023-04-18 MED ORDER — SODIUM CHLORIDE 0.9 % IV SOLN: INTRAVENOUS | Status: AC

## 2023-04-18 MED ORDER — AZELASTINE HCL 0.1 % NA SOLN: 1.0000 | NASAL | Status: DC | PRN

## 2023-04-18 MED ORDER — HYDRALAZINE HCL 20 MG/ML IJ SOLN: 2.0000 mg | Freq: Four times a day (QID) | INTRAMUSCULAR | Status: DC | PRN

## 2023-04-18 MED ORDER — IOHEXOL 300 MG/ML  SOLN
100.0000 mL | Freq: Once | INTRAMUSCULAR | Status: AC | PRN
  Administered 2023-04-18: 100 mL via INTRAVENOUS

## 2023-04-18 MED ORDER — MORPHINE SULFATE (PF) 4 MG/ML IV SOLN
4.0000 mg | Freq: Once | INTRAVENOUS | Status: AC
  Administered 2023-04-18: 4 mg via INTRAVENOUS
  Filled 2023-04-18: qty 1

## 2023-04-18 MED ORDER — MORPHINE SULFATE (PF) 2 MG/ML IV SOLN: 2.0000 mg | Freq: Once | INTRAVENOUS | Status: DC

## 2023-04-18 MED ORDER — ONDANSETRON HCL 4 MG/2ML IJ SOLN
4.0000 mg | Freq: Four times a day (QID) | INTRAMUSCULAR | Status: DC | PRN
  Administered 2023-04-18: 4 mg via INTRAVENOUS
  Filled 2023-04-18: qty 2

## 2023-04-18 MED ORDER — LORAZEPAM 0.5 MG PO TABS: 0.5000 mg | ORAL_TABLET | Freq: Three times a day (TID) | ORAL | Status: DC | PRN

## 2023-04-18 NOTE — H&P (Addendum)
History and Physical    AFRICA TALK HYQ:[id number] DOB: Apr 15, 1951 DOA: 04/18/2023  PCP: Wilford Corner, PA-C (Confirm with patient/family/NH records and if not entered, this has to be entered at [facility] point of entry) Patient coming from: Home  I have personally briefly reviewed patient's old medical records in [facility]  Chief Complaint: Belly hurts, feeling nausea  HPI: Savannah Griffin is a 72 y.o. female with medical history significant of anxiety, breast cancer status post lumpectomy and radiation therapy, B12 deficiency, presented with new onset of abdominal pain and nauseous vomiting.  Symptoms started last night patient woke up with severe 10/10 epigastric abdominal pain, constant associated with nausea and vomited x 1 of stomach content none bile nonbloody.  No exacerbation or relieving factors.  Denies any fever chills, no diarrhea.  At baseline, patient has been eating gluten-free diet for last 7 years after having repeated GI issues such as frequent abdominal pain after eating, she did go to see GI and did a screening blood test, it turned out to be negative for celiac disease, no EGD or biopsy was pursued.  3 days ago she ate a sandwich containing wheat bread and had a one-time loose diarrhea but no abdominal pain at that point.  She does not drink alcohol no history of high blood pressure or hyperlipidemia.  She has chronic B12 deficiency and get B12 injection every 4 weeks. ED Course: Afebrile, blood pressure slightly elevated.  CT abdomen pelvis with contrast showed no acute findings.  Blood work showed normal LFTs, lipase 678  Patient was given IV bolus x 1 and multiple morphine's however abdominal pain soon comes back  Review of Systems: As per HPI otherwise 14 point review of systems negative.    Past Medical History:  Diagnosis Date   Arthritis    Cancer (HCC)    BREAST   Complication of anesthesia    NO RECENT N/V   Finger mass, right    thumb   GERD  (gastroesophageal reflux disease)    PONV (postoperative nausea and vomiting)    H/O  NONE WITH RECENT SURGIES    Past Surgical History:  Procedure Laterality Date   ABDOMINAL HYSTERECTOMY     BREAST SURGERY     BX/ LUMPECTOMY with sentinel lymph biopsy 2001   CARPAL TUNNEL RELEASE     both hands   CATARACT EXTRACTION W/PHACO Left 10/26/2016   Procedure: CATARACT EXTRACTION PHACO AND INTRAOCULAR LENS PLACEMENT (IOC);  Surgeon: Galen Manila, MD;  Location: [facility] ORS;  Service: Ophthalmology;  Laterality: Left;  [place] 00:43 AP% 12.0 CDE 5.20 Fluid pack lot # [id number]   CATARACT EXTRACTION W/PHACO Right 11/16/2016   Procedure: CATARACT EXTRACTION PHACO AND INTRAOCULAR LENS PLACEMENT (IOC);  Surgeon: Galen Manila, MD;  Location: [facility] ORS;  Service: Ophthalmology;  Laterality: Right;  [place] 00:45.9 AP% 13.4 CDE 6.16 Fluid Pack lot # [id number]   COLONOSCOPY     CTR     DILATION AND CURETTAGE OF UTERUS     needle core biopsy     TONSILLECTOMY     TUBAL LIGATION       reports that she has never smoked. She has never used smokeless tobacco. She reports that she does not drink alcohol and does not use drugs.  Allergies  Allergen Reactions   Augmentin [Amoxicillin-Pot Clavulanate]    Avelox [Moxifloxacin]    Biaxin [Clarithromycin]    Cefdinir    Clindamycin/Lincomycin    Doxycycline    Macrodantin [Nitrofurantoin Macrocrystal]  Septra [Sulfamethoxazole-Trimethoprim]     History reviewed. No pertinent family history.   Prior to Admission medications   Medication Sig Start Date End Date Taking? Authorizing Provider  acetaminophen (TYLENOL) 500 MG tablet Take 500-1,000 mg by mouth every 8 (eight) hours as needed for mild pain or moderate pain.    [provider]  azelastine (ASTELIN) 0.1 % nasal spray Place 1 spray into both nostrils as needed. 05/27/22   [provider]  LORazepam (ATIVAN) 0.5 MG tablet Take 0.5 mg by mouth every 8 (eight) hours as needed for  anxiety.    [provider]  methylPREDNISolone (MEDROL DOSEPAK) 4 MG TBPK tablet 1 dosepak 12/26/22   Willy Eddy, MD  Olopatadine HCl (PATANASE) 0.6 % SOLN Place 1 spray into both nostrils daily as needed (allergies prn).     [provider]    Physical Exam: Vitals:   04/18/23 0537 04/18/23 0539 04/18/23 0700  BP: (!) 151/78  (!) 156/80  Pulse: 100  76  Resp: 18  20  Temp: 97.9 F (36.6 C)    TempSrc: Oral    SpO2: 94%  96%  Weight:  65.8 kg   Height:  5\' 2"  (1.575 m)     Constitutional: NAD, calm, comfortable Vitals:   04/18/23 0537 04/18/23 0539 04/18/23 0700  BP: (!) 151/78  (!) 156/80  Pulse: 100  76  Resp: 18  20  Temp: 97.9 F (36.6 C)    TempSrc: Oral    SpO2: 94%  96%  Weight:  65.8 kg   Height:  5\' 2"  (1.575 m)    Eyes: PERRL, lids and conjunctivae normal ENMT: Mucous membranes are moist. Posterior pharynx clear of any exudate or lesions.Normal dentition.  Neck: normal, supple, no masses, no thyromegaly Respiratory: clear to auscultation bilaterally, no wheezing, no crackles. Normal respiratory effort. No accessory muscle use.  Cardiovascular: Regular rate and rhythm, no murmurs / rubs / gallops. No extremity edema. 2+ pedal pulses. No carotid bruits.  Abdomen: Mild tenderness on deep palpation on epigastric, no rebound no guarding, no masses palpated. No hepatosplenomegaly. Bowel sounds positive.  Musculoskeletal: no clubbing / cyanosis. No joint deformity upper and lower extremities. Good ROM, no contractures. Normal muscle tone.  Skin: no rashes, lesions, ulcers. No induration Neurologic: CN 2-12 grossly intact. Sensation intact, DTR normal. Strength 5/5 in all 4.  Psychiatric: Normal judgment and insight. Alert and oriented x 3. Normal mood.     Labs on Admission: I have personally reviewed following labs and imaging studies  CBC: Recent Labs  Lab 04/18/23 0540  WBC 10.6*  NEUTROABS 9.7*  HGB 14.5  HCT 43.2  MCV 90.2  PLT  203   Basic Metabolic Panel: Recent Labs  Lab 04/18/23 0540  NA 137  K 3.6  CL 103  CO2 25  GLUCOSE 130*  BUN 16  CREATININE 0.59  CALCIUM 9.1   GFR: Estimated Creatinine Clearance: 58.3 mL/min (by C-G formula based on SCr of 0.59 mg/dL). Liver Function Tests: Recent Labs  Lab 04/18/23 0540  AST 22  ALT 21  ALKPHOS 100  BILITOT 0.8  PROT 6.7  ALBUMIN 4.1   Recent Labs  Lab 04/18/23 0540  LIPASE 678*   No results for input(s): "AMMONIA" in the last 168 hours. Coagulation Profile: No results for input(s): "INR", "PROTIME" in the last 168 hours. Cardiac Enzymes: No results for input(s): "CKTOTAL", "CKMB", "CKMBINDEX", "TROPONINI" in the last 168 hours. BNP (last 3 results) No results for input(s): "PROBNP" in  the last 8760 hours. HbA1C: No results for input(s): "HGBA1C" in the last 72 hours. CBG: No results for input(s): "GLUCAP" in the last 168 hours. Lipid Profile: No results for input(s): "CHOL", "HDL", "LDLCALC", "TRIG", "CHOLHDL", "LDLDIRECT" in the last 72 hours. Thyroid Function Tests: No results for input(s): "TSH", "T4TOTAL", "FREET4", "T3FREE", "THYROIDAB" in the last 72 hours. Anemia Panel: No results for input(s): "VITAMINB12", "FOLATE", "FERRITIN", "TIBC", "IRON", "RETICCTPCT" in the last 72 hours. Urine analysis:    Component Value Date/Time   COLORURINE STRAW (A) 04/18/2023 0645   APPEARANCEUR CLEAR (A) 04/18/2023 0645   LABSPEC 1.010 04/18/2023 0645   PHURINE 7.0 04/18/2023 0645   GLUCOSEU NEGATIVE 04/18/2023 0645   HGBUR SMALL (A) 04/18/2023 0645   BILIRUBINUR NEGATIVE 04/18/2023 0645   KETONESUR 20 (A) 04/18/2023 0645   PROTEINUR NEGATIVE 04/18/2023 0645   NITRITE NEGATIVE 04/18/2023 0645   LEUKOCYTESUR TRACE (A) 04/18/2023 0645    Radiological Exams on Admission: CT ABDOMEN PELVIS W CONTRAST Result Date: 04/18/2023 CLINICAL DATA:  Abdominal pain, acute, nonlocalized EXAM: CT ABDOMEN AND PELVIS WITH CONTRAST TECHNIQUE: Multidetector  CT imaging of the abdomen and pelvis was performed using the standard protocol following bolus administration of intravenous contrast. RADIATION DOSE REDUCTION: This exam was performed according to the departmental dose-optimization program which includes automated exposure control, adjustment of the mA and/or kV according to patient size and/or use of iterative reconstruction technique. CONTRAST:  OMNIPAQUE IOHEXOL 300 MG/ML  SOLN COMPARISON:  None Available. FINDINGS: Lower chest: There are subpleural atelectatic changes in the visualized lung bases. No overt consolidation. No pleural effusion. The heart is normal in size. No pericardial effusion. Hepatobiliary: The liver is normal in size. Non-cirrhotic configuration. These is diffuse hepatic steatosis. No suspicious mass. Note is made of at least 2, sub 5 mm, hypoattenuating foci in the liver, which are too small to adequately characterize. No intrahepatic or extrahepatic bile duct dilation. No calcified gallstones. Normal gallbladder wall thickness. No pericholecystic inflammatory changes. Pancreas: Unremarkable. No pancreatic ductal dilatation or surrounding inflammatory changes. Spleen: Within normal limits. No focal lesion. Adrenals/Urinary Tract: Adrenal glands are unremarkable. No suspicious renal mass. There is a 1.2 x 1.7 cm simple cyst in the right kidney upper pole. There are additional several sinus cysts throughout bilateral kidneys. No hydronephrosis. No renal or ureteric calculi. Unremarkable urinary bladder. Stomach/Bowel: There is a small sliding hiatal hernia. No disproportionate dilation of the small or large bowel loops. No evidence of abnormal bowel wall thickening or inflammatory changes. The appendix is unremarkable. Vascular/Lymphatic: No ascites or pneumoperitoneum. No abdominal or pelvic lymphadenopathy, by size criteria. No aneurysmal dilation of the major abdominal arteries. Reproductive: The uterus is surgically absent. No large  adnexal mass. Other: There is a tiny fat containing umbilical hernia. The soft tissues and abdominal wall are otherwise unremarkable. Musculoskeletal: No suspicious osseous lesions. There are mild multilevel degenerative changes in the visualized spine. IMPRESSION: *No acute inflammatory process identified within the abdomen or pelvis. *Multiple other nonacute observations, as described above. Electronically Signed   By: Jules Schick [name]   On: 04/18/2023 08:18    EKG: Independently reviewed.  Sinus rhythm, inverted T waves on lead II and V3  Assessment/Plan Principal Problem:   Pancreatitis Active Problems:   Acute pancreatitis  (please populate well all problems here in Problem List. (For example, if patient is on BP meds at home and you resume or decide to hold them, it is a problem that needs to be her. Same for CAD, COPD,  HLD and so on)  Acute pancreatitis, mild, BISAP=1 -Nongoal stone, nonalcoholic related. -Given there is a questionable history of celiac disease, will rule out autoimmune pancreatitis, sent IgG subclass, send lipid panel.  Outpatient follow-up with GI and PCP for the results.  Patient agreed with the plan -Symptomatic management, failed p.o. challenge this morning, will keep patient n.p.o. and start IV fluid.  As needed morphine and Zofran for pain and nausea symptoms. -Other DDx, troponin negative x 2 EKG showed no acute ST changes, ACS ruled out.   DVT prophylaxis: SCD Code Status: Full code Family Communication: None at bedside Disposition Plan: Expect less than 2 midnight hospital stay Consults called: None Admission status: MedSurg admission   Emeline General MD [facility] Pager [phone]  04/18/2023, 9:02 AM

## 2023-04-18 NOTE — ED Provider Notes (Signed)
[facility] Provider Note    Event Date/Time   First MD Initiated Contact with Patient 04/18/23 [phone]     (approximate)   History   Abdominal Pain   HPI  Savannah Griffin is a 72 y.o. female brought to the ED via EMS from home with a chief complaint of generalized abdominal pain and nausea since approximately 2 PM.  Denies associated fever/chills, chest pain, shortness of breath, vomiting, dysuria or diarrhea.  Denies recent travel.  Patient endorses gluten sensitivity and has been introducing it back into her diet recently.  Did eat a sandwich around noon yesterday.     Past Medical History   Past Medical History:  Diagnosis Date   Arthritis    Cancer (HCC)    BREAST   Complication of anesthesia    NO RECENT N/V   Finger mass, right    thumb   GERD (gastroesophageal reflux disease)    PONV (postoperative nausea and vomiting)    H/O  NONE WITH RECENT SURGIES     Active Problem List   Patient Active Problem List   Diagnosis Date Noted   Cervical spondylosis without myelopathy 01/05/2023     Past Surgical History   Past Surgical History:  Procedure Laterality Date   ABDOMINAL HYSTERECTOMY     BREAST SURGERY     BX/ LUMPECTOMY with sentinel lymph biopsy 2001   CARPAL TUNNEL RELEASE     both hands   CATARACT EXTRACTION W/PHACO Left 10/26/2016   Procedure: CATARACT EXTRACTION PHACO AND INTRAOCULAR LENS PLACEMENT (IOC);  Surgeon: Galen Manila, MD;  Location: [facility] ORS;  Service: Ophthalmology;  Laterality: Left;  [place] 00:43 AP% 12.0 CDE 5.20 Fluid pack lot # [id number]   CATARACT EXTRACTION W/PHACO Right 11/16/2016   Procedure: CATARACT EXTRACTION PHACO AND INTRAOCULAR LENS PLACEMENT (IOC);  Surgeon: Galen Manila, MD;  Location: [facility] ORS;  Service: Ophthalmology;  Laterality: Right;  [place] 00:45.9 AP% 13.4 CDE 6.16 Fluid Pack lot # [id number]   COLONOSCOPY     CTR     DILATION AND CURETTAGE OF UTERUS     needle core biopsy      TONSILLECTOMY     TUBAL LIGATION       Home Medications   Prior to Admission medications   Medication Sig Start Date End Date Taking? Authorizing Provider  acetaminophen (TYLENOL) 500 MG tablet Take 500-1,000 mg by mouth every 8 (eight) hours as needed for mild pain or moderate pain.    [provider]  azelastine (ASTELIN) 0.1 % nasal spray Place 1 spray into both nostrils as needed. 05/27/22   [provider]  LORazepam (ATIVAN) 0.5 MG tablet Take 0.5 mg by mouth every 8 (eight) hours as needed for anxiety.    [provider]  methylPREDNISolone (MEDROL DOSEPAK) 4 MG TBPK tablet 1 dosepak 12/26/22   Willy Eddy, MD  Olopatadine HCl (PATANASE) 0.6 % SOLN Place 1 spray into both nostrils daily as needed (allergies prn).     [provider]     Allergies  Augmentin [amoxicillin-pot clavulanate], Avelox [moxifloxacin], Biaxin [clarithromycin], Cefdinir, Clindamycin/lincomycin, Doxycycline, Macrodantin [nitrofurantoin macrocrystal], and Septra [sulfamethoxazole-trimethoprim]   Family History  History reviewed. No pertinent family history.   Physical Exam  Triage Vital Signs: ED Triage Vitals  Encounter Vitals Group     BP      Systolic BP Percentile      Diastolic BP Percentile      Pulse      Resp  Temp      Temp src      SpO2      Weight      Height      Head Circumference      Peak Flow      Pain Score      Pain Loc      Pain Education      Exclude from Growth Chart     Updated Vital Signs: BP (!) 151/78 (BP Location: Left Arm)   Pulse 100   Temp 97.9 F (36.6 C) (Oral)   Resp 18   Ht 5\' 2"  (1.575 m)   Wt 65.8 kg   SpO2 94%   BMI 26.52 kg/m    General: Awake, mild distress.  CV:  RRR.  Good peripheral perfusion.  Resp:  Normal effort.  CTAB. Abd:  Mild diffuse tenderness to palpation without rebound or guarding.  No distention.  Other:  No truncal vesicles.   ED Results / Procedures / Treatments  Labs (all  labs ordered are listed, but only abnormal results are displayed) Labs Reviewed  CBC WITH DIFFERENTIAL/PLATELET - Abnormal; Notable for the following components:      Result Value   WBC 10.6 (*)    Neutro Abs 9.7 (*)    Lymphs Abs 0.5 (*)    All other components within normal limits  COMPREHENSIVE METABOLIC PANEL  LIPASE, BLOOD  URINALYSIS, ROUTINE W REFLEX MICROSCOPIC  TROPONIN I (HIGH SENSITIVITY)     EKG  ED ECG REPORT I, Venezia Sargeant J, the attending physician, personally viewed and interpreted this ECG.   Date: 04/18/2023  EKG Time: 0603  Rate: 96  Rhythm: normal sinus rhythm  Axis: Normal  Intervals:none  ST&T Change: Nonspecific    RADIOLOGY  CT abdomen/pelvis pending  Official radiology report(s): No results found.   PROCEDURES:  Critical Care performed: No  .1-3 Lead EKG Interpretation  Performed by: Irean Hong, MD Authorized by: Irean Hong, MD     Interpretation: normal     ECG rate:  95   ECG rate assessment: normal     Rhythm: sinus rhythm     Ectopy: none     Conduction: normal   Comments:     Patient placed on cardiac monitor to evaluate for arrhythmias    MEDICATIONS ORDERED IN ED: Medications  ondansetron [facility]) injection 4 mg (4 mg Intravenous Given 04/18/23 0604)  morphine (PF) 2 MG/ML injection 2 mg (2 mg Intravenous Given 04/18/23 0604)  sodium chloride 0.9 % bolus 500 mL (0 mLs Intravenous Stopped 04/18/23 0644)     IMPRESSION / MDM / ASSESSMENT AND PLAN / ED COURSE  I reviewed the triage vital signs and the nursing notes.                             72 year old female presenting with abdominal pain and nausea. Differential diagnosis includes, but is not limited to, ovarian cyst, ovarian torsion, acute appendicitis, diverticulitis, urinary tract infection/pyelonephritis, endometriosis, bowel obstruction, colitis, renal colic, gastroenteritis, hernia, etc. I personally reviewed patient's records and note a PCP office visit on  01/27/2023 for skin lesion.  Patient's presentation is most consistent with acute complicated illness / injury requiring diagnostic workup.  The patient is on the cardiac monitor to evaluate for evidence of arrhythmia and/or significant heart rate changes.  Will obtain lab work, CT abdomen/pelvis.  Administer IV Morphine for pain, IV Zofran for nausea.  Will reassess.  Clinical Course as of 04/18/23 0646  Mon Apr 18, 2023  8119 Patient resting after receiving medications.  Laboratory results pending.  Care will be transferred to the oncoming provider at change of shift pending lab work, UA and CT scan. [JS]    Clinical Course User Index [JS] Irean Hong, MD     FINAL CLINICAL IMPRESSION(S) / ED DIAGNOSES   Final diagnoses:  Generalized abdominal pain  Nausea     Rx / DC Orders   ED Discharge Orders     None        Note:  This document was prepared using Dragon voice recognition software and may include unintentional dictation errors.   Irean Hong, MD 04/18/23 [phone]

## 2023-04-18 NOTE — ED Notes (Signed)
Pt to CT via stretcher and CT tech

## 2023-04-18 NOTE — ED Triage Notes (Signed)
Pt presents to ER via ems from home with c/o abd pain.  Pt states her abd pain starts in center of abdomen and radiates to both sides of flank/back.  Pain has been ongoing since appx 2pm yesterday.  Pt reports some associated nausea, but denies any vomiting or diarrhea.  Pt does report some sensitivity to gluten, and has been gradually introducing it back into her diet.  Pt states she did eat a sandwich around noon yesterday.  Pt is otherwise A&O x4 and in NAD at this time.

## 2023-04-18 NOTE — ED Provider Notes (Signed)
CT ABDOMEN PELVIS W CONTRAST Result Date: 04/18/2023 CLINICAL DATA:  Abdominal pain, acute, nonlocalized EXAM: CT ABDOMEN AND PELVIS WITH CONTRAST TECHNIQUE: Multidetector CT imaging of the abdomen and pelvis was performed using the standard protocol following bolus administration of intravenous contrast. RADIATION DOSE REDUCTION: This exam was performed according to the departmental dose-optimization program which includes automated exposure control, adjustment of the mA and/or kV according to patient size and/or use of iterative reconstruction technique. CONTRAST:  OMNIPAQUE IOHEXOL 300 MG/ML  SOLN COMPARISON:  None Available. FINDINGS: Lower chest: There are subpleural atelectatic changes in the visualized lung bases. No overt consolidation. No pleural effusion. The heart is normal in size. No pericardial effusion. Hepatobiliary: The liver is normal in size. Non-cirrhotic configuration. These is diffuse hepatic steatosis. No suspicious mass. Note is made of at least 2, sub 5 mm, hypoattenuating foci in the liver, which are too small to adequately characterize. No intrahepatic or extrahepatic bile duct dilation. No calcified gallstones. Normal gallbladder wall thickness. No pericholecystic inflammatory changes. Pancreas: Unremarkable. No pancreatic ductal dilatation or surrounding inflammatory changes. Spleen: Within normal limits. No focal lesion. Adrenals/Urinary Tract: Adrenal glands are unremarkable. No suspicious renal mass. There is a 1.2 x 1.7 cm simple cyst in the right kidney upper pole. There are additional several sinus cysts throughout bilateral kidneys. No hydronephrosis. No renal or ureteric calculi. Unremarkable urinary bladder. Stomach/Bowel: There is a small sliding hiatal hernia. No disproportionate dilation of the small or large bowel loops. No evidence of abnormal bowel wall thickening or inflammatory changes. The appendix is unremarkable. Vascular/Lymphatic: No ascites or  pneumoperitoneum. No abdominal or pelvic lymphadenopathy, by size criteria. No aneurysmal dilation of the major abdominal arteries. Reproductive: The uterus is surgically absent. No large adnexal mass. Other: There is a tiny fat containing umbilical hernia. The soft tissues and abdominal wall are otherwise unremarkable. Musculoskeletal: No suspicious osseous lesions. There are mild multilevel degenerative changes in the visualized spine. IMPRESSION: *No acute inflammatory process identified within the abdomen or pelvis. *Multiple other nonacute observations, as described above. Electronically Signed   By: Jules Schick [name]   On: 04/18/2023 08:18   Labs reviewed very slight leukocytosis.  Comprehensive metabolic panel is normal.  Lipase elevated significantly at almost 700.  Her CT imaging is reassuring she is afebrile she does not have a significantly elevated white count.  15,000.   ----------------------------------------- 8:30 AM on 04/18/2023 ----------------------------------------- The patient's pain is starting to increase, she advises pain is worsening.  It is mid abdomen.  She is not having active vomiting but reports her pain is worsening, morphine has not alleviated it.  She is fully awake alert and oriented and appears uncomfortable.  Will give additional slightly higher dose of morphine for which she is on cardiac and pulse oximetry.  She appears uncomfortable.  Discussed with the patient, given the degree of pain and discomfort as well as new diagnosis of acute pancreatitis with unknown etiology thus far, will consult with hospitalist for admission.   ----------------------------------------- 8:39 AM on 04/18/2023 ----------------------------------------- Accepted admission by Dr. Satira Anis, MD 04/18/23 [phone]

## 2023-04-19 ENCOUNTER — Other Ambulatory Visit: Payer: Self-pay

## 2023-04-19 DIAGNOSIS — Z853 Personal history of malignant neoplasm of breast: Secondary | ICD-10-CM | POA: Insufficient documentation

## 2023-04-19 DIAGNOSIS — K859 Acute pancreatitis without necrosis or infection, unspecified: Secondary | ICD-10-CM | POA: Insufficient documentation

## 2023-04-19 DIAGNOSIS — N390 Urinary tract infection, site not specified: Secondary | ICD-10-CM | POA: Insufficient documentation

## 2023-04-19 LAB — LIPASE, BLOOD: Lipase: 126 U/L — ABNORMAL HIGH (ref 11–51)

## 2023-04-19 LAB — CBC
HCT: 39 % (ref 36.0–46.0)
Hemoglobin: 13.3 g/dL (ref 12.0–15.0)
MCH: 30.6 pg (ref 26.0–34.0)
MCHC: 34.1 g/dL (ref 30.0–36.0)
MCV: 89.7 fL (ref 80.0–100.0)
Platelets: 162 10*3/uL (ref 150–400)
RBC: 4.35 MIL/uL (ref 3.87–5.11)
RDW: 11.8 % (ref 11.5–15.5)
WBC: 10.4 10*3/uL (ref 4.0–10.5)
nRBC: 0 % (ref 0.0–0.2)

## 2023-04-19 LAB — IGG 1, 2, 3, AND 4
IgG (Immunoglobin G), Serum: 542 mg/dL — ABNORMAL LOW (ref 586–1602)
IgG, Subclass 1: 299 mg/dL (ref 248–810)
IgG, Subclass 2: 144 mg/dL (ref 130–555)
IgG, Subclass 3: 7 mg/dL — ABNORMAL LOW (ref 15–102)
IgG, Subclass 4: 4 mg/dL (ref 2–96)

## 2023-04-19 LAB — HIV ANTIBODY (ROUTINE TESTING W REFLEX): HIV Screen 4th Generation wRfx: NONREACTIVE

## 2023-04-19 NOTE — ED Notes (Signed)
No labs per Scotty Court MD

## 2023-04-19 NOTE — TOC CM/SW Note (Signed)
Transition of Care [facility]) - Inpatient Brief Assessment   Patient Details  Name: SERYN BARTIE MRN: [id number] Date of Birth: 05-Jan-1951  Transition of Care [facility]) CM/SW Contact:    Chapman Fitch, RN Phone Number: 04/19/2023, 12:40 PM   Clinical Narrative:   Transition of Care [facility]) Screening Note   Patient Details  Name: JANYE VANDEVEN Date of Birth: 03-23-1951   Transition of Care [facility]) CM/SW Contact:    Chapman Fitch, RN Phone Number: 04/19/2023, 12:40 PM    Transition of Care Department [facility]) has reviewed patient and no TOC needs have been identified at this time. . If new patient transition needs arise, please place a TOC consult.    Transition of Care Asessment: Insurance and Status: Insurance coverage has been reviewed Patient has primary care physician: Yes     Prior/Current Home Services: No current home services Social Drivers of Health Review: SDOH reviewed no interventions necessary Readmission risk has been reviewed: Yes Transition of care needs: no transition of care needs at this time

## 2023-04-19 NOTE — Progress Notes (Signed)
Discharge instructions reviewed with the patient.  Patient sent out via wheelchair with belongings 

## 2023-04-19 NOTE — ED Triage Notes (Signed)
Pt to ED via POV c/o abdominal pain. Pt was admitted here for pancreatitis and was discharged today around 3:30pm. Pt states that pain has come back. Denies CP, SHOB, dizziness, fevers.

## 2023-04-19 NOTE — Care Management Obs Status (Signed)
MEDICARE OBSERVATION STATUS NOTIFICATION   Patient Details  Name: Savannah Griffin MRN: [id number] Date of Birth: 03/14/1951   Medicare Observation Status Notification Given:  Yes    Chapman Fitch, RN 04/19/2023, 12:39 PM

## 2023-04-19 NOTE — Discharge Summary (Signed)
Physician Discharge Summary   Patient: Savannah Griffin MRN: [id number] DOB: 12-Feb-1951  Admit date:     04/18/2023  Discharge date: 04/19/23  Discharge Physician: Leondra Cullin   PCP: Wilford Corner, PA-C   Recommendations at discharge:    Parke Simmers diet on discharge. Advanvce as tolerated  Discharge Diagnoses: Active Problems:   Acute pancreatitis  Resolved Problems:   * No resolved hospital problems. *  Hospital Course:   Savannah Griffin is a 72 y.o. female with medical history significant of anxiety, breast cancer status post lumpectomy and radiation therapy, B12 deficiency, presented with new onset of abdominal pain and nauseous vomiting.   Symptoms started last night patient woke up with severe 10/10 epigastric abdominal pain, constant associated with nausea and vomited x 1 of stomach content none bile nonbloody.  No exacerbation or relieving factors.  Denies any fever chills, no diarrhea.  At baseline, patient has been eating gluten-free diet for last 7 years after having repeated GI issues such as frequent abdominal pain after eating, she did go to see GI and did a screening blood test, it turned out to be negative for celiac disease, no EGD or biopsy was pursued.  3 days ago she ate a sandwich containing wheat bread and had a one-time loose diarrhea but no abdominal pain at that point.  She does not drink alcohol no history of high blood pressure or hyperlipidemia.  She has chronic B12 deficiency and get B12 injection every 4 weeks. ED Course: Afebrile, blood pressure slightly elevated.  CT abdomen pelvis with contrast showed no acute findings.  Blood work showed normal LFTs, lipase 678   Patient was given IV bolus x 1 and multiple doses of morphine's however abdominal pain recurred      Assessment and plan  Acute pancreatitis, mild, BISAP=1 -No gall stones on imaging, nonalcoholic related. -Given there is a questionable history of celiac disease, will rule out autoimmune  pancreatitis, sent IgG subclass. Triglycerides are normal Outpatient follow-up with GI and PCP for the results.  Patient agreed with the plan -Symptoms improved with supportive care Patient was able to tolerate her diet and will be discharged home            Consultants: None Procedures performed: None  Disposition: Home Diet recommendation:  Discharge Diet Orders (From admission, onward)     Start     Ordered   04/19/23 0000  Diet - low sodium heart healthy        04/19/23 1416           Cardiac diet DISCHARGE MEDICATION: Allergies as of 04/19/2023       Reactions   Augmentin [amoxicillin-pot Clavulanate]    Avelox [moxifloxacin]    Biaxin [clarithromycin]    Cefdinir    Clindamycin/lincomycin    Doxycycline    Macrodantin [nitrofurantoin Macrocrystal]    Septra [sulfamethoxazole-trimethoprim]         Medication List     STOP taking these medications    azelastine 0.1 % nasal spray Commonly known as: ASTELIN   fluticasone 50 MCG/ACT nasal spray Commonly known as: FLONASE   methylPREDNISolone 4 MG Tbpk tablet Commonly known as: MEDROL DOSEPAK   Patanase 0.6 % Soln Generic drug: Olopatadine HCl       TAKE these medications    acetaminophen 500 MG tablet Commonly known as: TYLENOL Take 500-1,000 mg by mouth every 8 (eight) hours as needed for mild pain or moderate pain.   cyanocobalamin 1000 MCG/ML injection Commonly  known as: VITAMIN B12 Inject 1,000 mcg into the muscle every 30 (thirty) days.   LORazepam 0.5 MG tablet Commonly known as: ATIVAN Take 0.5 mg by mouth every 8 (eight) hours as needed for anxiety.        Discharge Exam: Filed Weights   04/18/23 0539  Weight: 65.8 kg   Eyes: PERRL, lids and conjunctivae normal ENMT: Mucous membranes are moist. Posterior pharynx clear of any exudate or lesions.Normal dentition.  Neck: normal, supple, no masses, no thyromegaly Respiratory: clear to auscultation bilaterally, no  wheezing, no crackles. Normal respiratory effort. No accessory muscle use.  Cardiovascular: Regular rate and rhythm, no murmurs / rubs / gallops. No extremity edema. 2+ pedal pulses. No carotid bruits.  Abdomen: non tender, no rebound no guarding, no masses palpated. No hepatosplenomegaly. Bowel sounds positive.  Musculoskeletal: no clubbing / cyanosis. No joint deformity upper and lower extremities. Good ROM, no contractures. Normal muscle tone.  Skin: no rashes, lesions, ulcers. No induration Neurologic: CN 2-12 grossly intact. Sensation intact, DTR normal. Strength 5/5 in all 4.  Psychiatric: Normal judgment and insight. Alert and oriented x 3. Normal mood.       Condition at discharge: stable  The results of significant diagnostics from this hospitalization (including imaging, microbiology, ancillary and laboratory) are listed below for reference.   Imaging Studies: CT ABDOMEN PELVIS W CONTRAST Result Date: 04/18/2023 CLINICAL DATA:  Abdominal pain, acute, nonlocalized EXAM: CT ABDOMEN AND PELVIS WITH CONTRAST TECHNIQUE: Multidetector CT imaging of the abdomen and pelvis was performed using the standard protocol following bolus administration of intravenous contrast. RADIATION DOSE REDUCTION: This exam was performed according to the departmental dose-optimization program which includes automated exposure control, adjustment of the mA and/or kV according to patient size and/or use of iterative reconstruction technique. CONTRAST:  OMNIPAQUE IOHEXOL 300 MG/ML  SOLN COMPARISON:  None Available. FINDINGS: Lower chest: There are subpleural atelectatic changes in the visualized lung bases. No overt consolidation. No pleural effusion. The heart is normal in size. No pericardial effusion. Hepatobiliary: The liver is normal in size. Non-cirrhotic configuration. These is diffuse hepatic steatosis. No suspicious mass. Note is made of at least 2, sub 5 mm, hypoattenuating foci in the liver, which are  too small to adequately characterize. No intrahepatic or extrahepatic bile duct dilation. No calcified gallstones. Normal gallbladder wall thickness. No pericholecystic inflammatory changes. Pancreas: Unremarkable. No pancreatic ductal dilatation or surrounding inflammatory changes. Spleen: Within normal limits. No focal lesion. Adrenals/Urinary Tract: Adrenal glands are unremarkable. No suspicious renal mass. There is a 1.2 x 1.7 cm simple cyst in the right kidney upper pole. There are additional several sinus cysts throughout bilateral kidneys. No hydronephrosis. No renal or ureteric calculi. Unremarkable urinary bladder. Stomach/Bowel: There is a small sliding hiatal hernia. No disproportionate dilation of the small or large bowel loops. No evidence of abnormal bowel wall thickening or inflammatory changes. The appendix is unremarkable. Vascular/Lymphatic: No ascites or pneumoperitoneum. No abdominal or pelvic lymphadenopathy, by size criteria. No aneurysmal dilation of the major abdominal arteries. Reproductive: The uterus is surgically absent. No large adnexal mass. Other: There is a tiny fat containing umbilical hernia. The soft tissues and abdominal wall are otherwise unremarkable. Musculoskeletal: No suspicious osseous lesions. There are mild multilevel degenerative changes in the visualized spine. IMPRESSION: *No acute inflammatory process identified within the abdomen or pelvis. *Multiple other nonacute observations, as described above. Electronically Signed   By: Jules Schick [name]   On: 04/18/2023 08:18    Microbiology: No results  found for this or any previous visit.  Labs: CBC: Recent Labs  Lab 04/18/23 0540 04/19/23 0426  WBC 10.6* 10.4  NEUTROABS 9.7*  --   HGB 14.5 13.3  HCT 43.2 39.0  MCV 90.2 89.7  PLT 203 162   Basic Metabolic Panel: Recent Labs  Lab 04/18/23 0540  NA 137  K 3.6  CL 103  CO2 25  GLUCOSE 130*  BUN 16  CREATININE 0.59  CALCIUM 9.1   Liver Function  Tests: Recent Labs  Lab 04/18/23 0540  AST 22  ALT 21  ALKPHOS 100  BILITOT 0.8  PROT 6.7  ALBUMIN 4.1   CBG: No results for input(s): "GLUCAP" in the last 168 hours.  Discharge time spent: greater than 30 minutes.  Signed: Lucile Shutters, MD [facility] 04/19/2023

## 2023-04-19 NOTE — ED Notes (Signed)
ED TO INPATIENT HANDOFF REPORT  ED Nurse Name and Phone #: Tivis Wherry RN [phone]  S Name/Age/Gender Savannah Griffin 72 y.o. female Room/Bed: ED31A/ED31A  Code Status   Code Status: Full Code  Home/SNF/Other Home Patient oriented to: self, place, time, and situation Is this baseline? Yes   Triage Complete: Triage complete  Chief Complaint Pancreatitis [K85.90]  Triage Note Pt presents to ER via ems from home with c/o abd pain.  Pt states her abd pain starts in center of abdomen and radiates to both sides of flank/back.  Pain has been ongoing since appx 2pm yesterday.  Pt reports some associated nausea, but denies any vomiting or diarrhea.  Pt does report some sensitivity to gluten, and has been gradually introducing it back into her diet.  Pt states she did eat a sandwich around noon yesterday.  Pt is otherwise A&O x4 and in NAD at this time.     Allergies Allergies  Allergen Reactions   Augmentin [Amoxicillin-Pot Clavulanate]    Avelox [Moxifloxacin]    Biaxin [Clarithromycin]    Cefdinir    Clindamycin/Lincomycin    Doxycycline    Macrodantin [Nitrofurantoin Macrocrystal]    Septra [Sulfamethoxazole-Trimethoprim]     Level of Care/Admitting Diagnosis ED Disposition     ED Disposition  Admit   Condition  --   Comment  Hospital Area: [facility] [[id number]]  Level of Care: Med-Surg [16]  Covid Evaluation: Asymptomatic - no recent exposure (last 10 days) testing not required  Diagnosis: Pancreatitis [401027]  Admitting Physician: Emeline General [[id number]]  Attending Physician: Emeline General [[id number]]          B Medical/Surgery History Past Medical History:  Diagnosis Date   Arthritis    Cancer (HCC)    BREAST   Complication of anesthesia    NO RECENT N/V   Finger mass, right    thumb   GERD (gastroesophageal reflux disease)    PONV (postoperative nausea and vomiting)    H/O  NONE WITH RECENT SURGIES   Past Surgical History:   Procedure Laterality Date   ABDOMINAL HYSTERECTOMY     BREAST SURGERY     BX/ LUMPECTOMY with sentinel lymph biopsy 2001   CARPAL TUNNEL RELEASE     both hands   CATARACT EXTRACTION W/PHACO Left 10/26/2016   Procedure: CATARACT EXTRACTION PHACO AND INTRAOCULAR LENS PLACEMENT (IOC);  Surgeon: Galen Manila, MD;  Location: [facility] ORS;  Service: Ophthalmology;  Laterality: Left;  [place] 00:43 AP% 12.0 CDE 5.20 Fluid pack lot # [id number]   CATARACT EXTRACTION W/PHACO Right 11/16/2016   Procedure: CATARACT EXTRACTION PHACO AND INTRAOCULAR LENS PLACEMENT (IOC);  Surgeon: Galen Manila, MD;  Location: [facility] ORS;  Service: Ophthalmology;  Laterality: Right;  [place] 00:45.9 AP% 13.4 CDE 6.16 Fluid Pack lot # [id number]   COLONOSCOPY     CTR     DILATION AND CURETTAGE OF UTERUS     needle core biopsy     TONSILLECTOMY     TUBAL LIGATION       A IV Location/Drains/Wounds Patient Lines/Drains/Airways Status     Active Line/Drains/Airways     Name Placement date Placement time Site Days   Peripheral IV 04/18/23 20 G Left Antecubital 04/18/23  0539  Antecubital  1            Intake/Output Last 24 hours  Intake/Output Summary (Last 24 hours) at 04/19/2023 0033 Last data filed at 04/18/2023 0644 Gross per 24 hour  Intake 500 ml  Output --  Net 500 ml    Labs/Imaging Results for orders placed or performed during the hospital encounter of 04/18/23 (from the past 48 hours)  CBC with Differential     Status: Abnormal   Collection Time: 04/18/23  5:40 AM  Result Value Ref Range   WBC 10.6 (H) 4.0 - 10.5 K/uL   RBC 4.79 3.87 - 5.11 MIL/uL   Hemoglobin 14.5 12.0 - 15.0 g/dL   HCT 16.1 09.6 - 04.5 %   MCV 90.2 80.0 - 100.0 fL   MCH 30.3 26.0 - 34.0 pg   MCHC 33.6 30.0 - 36.0 g/dL   RDW 40.9 81.1 - 91.4 %   Platelets 203 150 - 400 K/uL   nRBC 0.0 0.0 - 0.2 %   Neutrophils Relative % 91 %   Neutro Abs 9.7 (H) 1.7 - 7.7 K/uL   Lymphocytes Relative 4 %   Lymphs Abs 0.5 (L) 0.7 - 4.0  K/uL   Monocytes Relative 4 %   Monocytes Absolute 0.4 0.1 - 1.0 K/uL   Eosinophils Relative 0 %   Eosinophils Absolute 0.0 0.0 - 0.5 K/uL   Basophils Relative 0 %   Basophils Absolute 0.0 0.0 - 0.1 K/uL   Immature Granulocytes 1 %   Abs Immature Granulocytes 0.05 0.00 - 0.07 K/uL    Comment: Performed at [facility], [address] [place], [place] [address]  Comprehensive metabolic panel     Status: Abnormal   Collection Time: 04/18/23  5:40 AM  Result Value Ref Range   Sodium 137 135 - 145 mmol/L   Potassium 3.6 3.5 - 5.1 mmol/L   Chloride 103 98 - 111 mmol/L   CO2 25 22 - 32 mmol/L   Glucose, Bld 130 (H) 70 - 99 mg/dL    Comment: Glucose reference range applies only to samples taken after fasting for at least 8 hours.   BUN 16 8 - 23 mg/dL   Creatinine, Ser 6.21 0.44 - 1.00 mg/dL   Calcium 9.1 8.9 - 30.8 mg/dL   Total Protein 6.7 6.5 - 8.1 g/dL   Albumin 4.1 3.5 - 5.0 g/dL   AST 22 15 - 41 U/L   ALT 21 0 - 44 U/L   Alkaline Phosphatase 100 38 - 126 U/L   Total Bilirubin 0.8 <1.2 mg/dL   GFR, Estimated >65 >78 mL/min    Comment: (NOTE) Calculated using the CKD-EPI Creatinine Equation (2021)    Anion gap 9 5 - 15    Comment: Performed at [facility], [address] [place], [place] [address]  Lipase, blood     Status: Abnormal   Collection Time: 04/18/23  5:40 AM  Result Value Ref Range   Lipase 678 (H) 11 - 51 U/L    Comment: RESULT CONFIRMED BY MANUAL DILUTION HNM Performed at [facility], [address] [place], [place] [address]   Troponin I (High Sensitivity)     Status: None   Collection Time: 04/18/23  5:40 AM  Result Value Ref Range   Troponin I (High Sensitivity) 16 <18 ng/L    Comment: (NOTE) Elevated high sensitivity troponin I (hsTnI) values and significant  changes across serial measurements may suggest ACS but many other  chronic and acute conditions are known to elevate hsTnI results.  Refer to the "Links" section  for chest pain algorithms and additional  guidance. Performed at [facility], [address], [place], [place] [address]   Lipid panel     Status:  Abnormal   Collection Time: 04/18/23  5:40 AM  Result Value Ref Range   Cholesterol 209 (H) 0 - 200 mg/dL   Triglycerides 68 <782 mg/dL   HDL 54 >95 mg/dL   Total CHOL/HDL Ratio 3.9 RATIO   VLDL 14 0 - 40 mg/dL   LDL Cholesterol 621 (H) 0 - 99 mg/dL    Comment:        Total Cholesterol/HDL:CHD Risk Coronary Heart Disease Risk Table                     Men   Women  1/2 Average Risk   3.4   3.3  Average Risk       5.0   4.4  2 X Average Risk   9.6   7.1  3 X Average Risk  23.4   11.0        Use the calculated Patient Ratio above and the CHD Risk Table to determine the patient's CHD Risk.        ATP III CLASSIFICATION (LDL):  <100     mg/dL   Optimal  308-657  mg/dL   Near or Above                    Optimal  130-159  mg/dL   Borderline  846-962  mg/dL   High  >952     mg/dL   Very High Performed at [facility], [address] [place], [place] [address]   Urinalysis, Routine w reflex microscopic -Urine, Clean Catch     Status: Abnormal   Collection Time: 04/18/23  6:45 AM  Result Value Ref Range   Color, Urine STRAW (A) YELLOW   APPearance CLEAR (A) CLEAR   Specific Gravity, Urine 1.010 1.005 - 1.030   pH 7.0 5.0 - 8.0   Glucose, UA NEGATIVE NEGATIVE mg/dL   Hgb urine dipstick SMALL (A) NEGATIVE   Bilirubin Urine NEGATIVE NEGATIVE   Ketones, ur 20 (A) NEGATIVE mg/dL   Protein, ur NEGATIVE NEGATIVE mg/dL   Nitrite NEGATIVE NEGATIVE   Leukocytes,Ua TRACE (A) NEGATIVE   RBC / HPF 0-5 0 - 5 RBC/hpf   WBC, UA 0-5 0 - 5 WBC/hpf   Bacteria, UA RARE (A) NONE SEEN   Squamous Epithelial / HPF 0-5 0 - 5 /HPF   Mucus PRESENT     Comment: Performed at [facility], [address] [place], [place] [address]   CT ABDOMEN PELVIS W CONTRAST Result Date: 04/18/2023 CLINICAL DATA:  Abdominal pain,  acute, nonlocalized EXAM: CT ABDOMEN AND PELVIS WITH CONTRAST TECHNIQUE: Multidetector CT imaging of the abdomen and pelvis was performed using the standard protocol following bolus administration of intravenous contrast. RADIATION DOSE REDUCTION: This exam was performed according to the departmental dose-optimization program which includes automated exposure control, adjustment of the mA and/or kV according to patient size and/or use of iterative reconstruction technique. CONTRAST:  OMNIPAQUE IOHEXOL 300 MG/ML  SOLN COMPARISON:  None Available. FINDINGS: Lower chest: There are subpleural atelectatic changes in the visualized lung bases. No overt consolidation. No pleural effusion. The heart is normal in size. No pericardial effusion. Hepatobiliary: The liver is normal in size. Non-cirrhotic configuration. These is diffuse hepatic steatosis. No suspicious mass. Note is made of at least 2, sub 5 mm, hypoattenuating foci in the liver, which are too small to adequately characterize. No intrahepatic or extrahepatic bile duct dilation. No calcified gallstones. Normal gallbladder wall thickness. No pericholecystic inflammatory changes.  Pancreas: Unremarkable. No pancreatic ductal dilatation or surrounding inflammatory changes. Spleen: Within normal limits. No focal lesion. Adrenals/Urinary Tract: Adrenal glands are unremarkable. No suspicious renal mass. There is a 1.2 x 1.7 cm simple cyst in the right kidney upper pole. There are additional several sinus cysts throughout bilateral kidneys. No hydronephrosis. No renal or ureteric calculi. Unremarkable urinary bladder. Stomach/Bowel: There is a small sliding hiatal hernia. No disproportionate dilation of the small or large bowel loops. No evidence of abnormal bowel wall thickening or inflammatory changes. The appendix is unremarkable. Vascular/Lymphatic: No ascites or pneumoperitoneum. No abdominal or pelvic lymphadenopathy, by size criteria. No aneurysmal dilation  of the major abdominal arteries. Reproductive: The uterus is surgically absent. No large adnexal mass. Other: There is a tiny fat containing umbilical hernia. The soft tissues and abdominal wall are otherwise unremarkable. Musculoskeletal: No suspicious osseous lesions. There are mild multilevel degenerative changes in the visualized spine. IMPRESSION: *No acute inflammatory process identified within the abdomen or pelvis. *Multiple other nonacute observations, as described above. Electronically Signed   By: Jules Schick [name]   On: 04/18/2023 08:18    Pending Labs Unresulted Labs (From admission, onward)     Start     Ordered   04/19/23 0500  Lipase, blood  Tomorrow morning,   R        04/18/23 0835   04/19/23 0500  CBC  Tomorrow morning,   R        04/18/23 0906   04/18/23 0902  HIV Antibody (routine testing w rflx)  (HIV Antibody (Routine testing w reflex) panel)  Once,   R        04/18/23 0902   04/18/23 0837  IgG 1, 2, 3, and 4  Add-on,   AD        04/18/23 0836            Vitals/Pain Today's Vitals   04/18/23 1640 04/18/23 1838 04/18/23 2212 04/18/23 2215  BP:    135/65  Pulse:    93  Resp:    16  Temp:    98.6 F (37 C)  TempSrc:    Oral  SpO2:    99%  Weight:      Height:      PainSc: Asleep 7  5      Isolation Precautions No active isolations  Medications Medications  acetaminophen (TYLENOL) tablet 500-1,000 mg (1,000 mg Oral Given 04/18/23 2212)  LORazepam (ATIVAN) tablet 0.5 mg (has no administration in time range)  azelastine (ASTELIN) 0.1 % nasal spray 1 spray (has no administration in time range)  0.9 %  sodium chloride infusion ( Intravenous Rate/Dose Verify 04/18/23 2210)  morphine (PF) 2 MG/ML injection 2 mg (2 mg Intravenous Given 04/18/23 1839)  ondansetron (ZOFRAN) tablet 4 mg ( Oral See Alternative 04/18/23 1136)    Or  ondansetron (ZOFRAN) injection 4 mg (4 mg Intravenous Given 04/18/23 1136)  hydrALAZINE (APRESOLINE) injection 2 mg (has no  administration in time range)  ondansetron (ZOFRAN) injection 4 mg (4 mg Intravenous Given 04/18/23 0604)  morphine (PF) 2 MG/ML injection 2 mg (2 mg Intravenous Given 04/18/23 0604)  sodium chloride 0.9 % bolus 500 mL (0 mLs Intravenous Stopped 04/18/23 0644)  iohexol (OMNIPAQUE) 300 MG/ML solution 100 mL (100 mLs Intravenous Contrast Given 04/18/23 0800)  sodium chloride 0.9 % bolus 500 mL (500 mLs Intravenous Bolus 04/18/23 0853)  morphine (PF) 4 MG/ML injection 4 mg (4 mg Intravenous Given 04/18/23 0853)    Mobility walks  Focused Assessments Cardiac Assessment Handoff:  Cardiac Rhythm: Normal sinus rhythm No results found for: "CKTOTAL", "CKMB", "CKMBINDEX", "TROPONINI" No results found for: "DDIMER" Does the Patient currently have chest pain? No    R Recommendations: See Admitting Provider Note  Report given to:   Additional Notes: .

## 2023-04-20 ENCOUNTER — Emergency Department
Admission: EM | Admit: 2023-04-20 | Discharge: 2023-04-20 | Disposition: A | Discharge status: Home / Self Care | Payer: Medicare HMO | Source: Home / Self Care | Attending: Emergency Medicine | Admitting: Emergency Medicine

## 2023-04-20 ENCOUNTER — Telehealth: Payer: Self-pay | Admitting: Emergency Medicine

## 2023-04-20 DIAGNOSIS — R1013 Epigastric pain: Secondary | ICD-10-CM

## 2023-04-20 DIAGNOSIS — K859 Acute pancreatitis without necrosis or infection, unspecified: Secondary | ICD-10-CM

## 2023-04-20 DIAGNOSIS — N390 Urinary tract infection, site not specified: Secondary | ICD-10-CM

## 2023-04-20 LAB — CBC WITH DIFFERENTIAL/PLATELET
Abs Immature Granulocytes: 0.03 10*3/uL (ref 0.00–0.07)
Basophils Absolute: 0 10*3/uL (ref 0.0–0.1)
Basophils Relative: 0 %
Eosinophils Absolute: 0.2 10*3/uL (ref 0.0–0.5)
Eosinophils Relative: 2 %
HCT: 39.5 % (ref 36.0–46.0)
Hemoglobin: 13.4 g/dL (ref 12.0–15.0)
Immature Granulocytes: 0 %
Lymphocytes Relative: 6 %
Lymphs Abs: 0.6 10*3/uL — ABNORMAL LOW (ref 0.7–4.0)
MCH: 30.9 pg (ref 26.0–34.0)
MCHC: 33.9 g/dL (ref 30.0–36.0)
MCV: 91 fL (ref 80.0–100.0)
Monocytes Absolute: 0.6 10*3/uL (ref 0.1–1.0)
Monocytes Relative: 6 %
Neutro Abs: 8.7 10*3/uL — ABNORMAL HIGH (ref 1.7–7.7)
Neutrophils Relative %: 86 %
Platelets: 183 10*3/uL (ref 150–400)
RBC: 4.34 MIL/uL (ref 3.87–5.11)
RDW: 11.7 % (ref 11.5–15.5)
WBC: 10.2 10*3/uL (ref 4.0–10.5)
nRBC: 0 % (ref 0.0–0.2)

## 2023-04-20 LAB — COMPREHENSIVE METABOLIC PANEL
ALT: 13 U/L (ref 0–44)
AST: 16 U/L (ref 15–41)
Albumin: 3.6 g/dL (ref 3.5–5.0)
Alkaline Phosphatase: 79 U/L (ref 38–126)
Anion gap: 7 (ref 5–15)
BUN: 10 mg/dL (ref 8–23)
CO2: 25 mmol/L (ref 22–32)
Calcium: 8.4 mg/dL — ABNORMAL LOW (ref 8.9–10.3)
Chloride: 104 mmol/L (ref 98–111)
Creatinine, Ser: 0.57 mg/dL (ref 0.44–1.00)
GFR, Estimated: >60 mL/min (ref >60)
Glucose, Bld: 96 mg/dL (ref 70–99)
Potassium: 3.7 mmol/L (ref 3.5–5.1)
Sodium: 136 mmol/L (ref 135–145)
Total Bilirubin: 1 mg/dL (ref <1.2)
Total Protein: 6.3 g/dL — ABNORMAL LOW (ref 6.5–8.1)

## 2023-04-20 LAB — URINALYSIS, ROUTINE W REFLEX MICROSCOPIC
Bacteria, UA: NONE SEEN
Bilirubin Urine: NEGATIVE
Glucose, UA: NEGATIVE mg/dL
Ketones, ur: 80 mg/dL — AB
Nitrite: NEGATIVE
Protein, ur: NEGATIVE mg/dL
Specific Gravity, Urine: 1.02 (ref 1.005–1.030)
pH: 5 (ref 5.0–8.0)

## 2023-04-20 LAB — LIPASE, BLOOD: Lipase: 114 U/L — ABNORMAL HIGH (ref 11–51)

## 2023-04-20 MED ORDER — CEPHALEXIN 500 MG PO CAPS: 500.0000 mg | ORAL_CAPSULE | Freq: Three times a day (TID) | ORAL | 0 refills | Status: DC

## 2023-04-20 MED ORDER — ONDANSETRON 4 MG PO TBDP: 4.0000 mg | ORAL_TABLET | Freq: Three times a day (TID) | ORAL | 0 refills | Status: DC | PRN

## 2023-04-20 MED ORDER — SODIUM CHLORIDE 0.9 % IV BOLUS
1000.0000 mL | Freq: Once | INTRAVENOUS | Status: AC
Start: 2023-04-20 — End: 2023-04-20
  Administered 2023-04-20: 1000 mL via INTRAVENOUS

## 2023-04-20 MED ORDER — ONDANSETRON HCL 4 MG/2ML IJ SOLN
4.0000 mg | Freq: Once | INTRAMUSCULAR | Status: AC
  Administered 2023-04-20: 4 mg via INTRAVENOUS
  Filled 2023-04-20: qty 2

## 2023-04-20 MED ORDER — OXYCODONE-ACETAMINOPHEN 5-325 MG PO TABS: 1.0000 | ORAL_TABLET | ORAL | 0 refills | Status: DC | PRN

## 2023-04-20 MED ORDER — CEFTRIAXONE SODIUM 1 G IJ SOLR
1.0000 g | Freq: Once | INTRAMUSCULAR | Status: AC
  Administered 2023-04-20: 1 g via INTRAVENOUS
  Filled 2023-04-20: qty 10

## 2023-04-20 MED ORDER — MORPHINE SULFATE (PF) 4 MG/ML IV SOLN
4.0000 mg | Freq: Once | INTRAVENOUS | Status: AC
  Administered 2023-04-20: 4 mg via INTRAVENOUS
  Filled 2023-04-20: qty 1

## 2023-04-20 MED ORDER — ONDANSETRON 4 MG PO TBDP: 4.0000 mg | ORAL_TABLET | Freq: Three times a day (TID) | ORAL | 0 refills | Status: AC | PRN

## 2023-04-20 NOTE — ED Provider Notes (Signed)
[facility] Provider Note    Event Date/Time   First MD Initiated Contact with Patient 04/20/23 0244     (approximate)   History   Abdominal Pain   HPI  Savannah Griffin is a 72 y.o. female who presents to the ED from home with a chief complaint of epigastric abdominal pain.  Patient was recently hospitalized for same, diagnosed with pancreatitis.  Discharged home around 3 PM yesterday without analgesics, antiemetics or antibiotics for UTI.  Returns due to recurrent pain associated with nausea, no vomiting.  Denies fever/chills, chest pain, shortness of breath, diarrhea.     Past Medical History   Past Medical History:  Diagnosis Date   Arthritis    Cancer (HCC)    BREAST   Complication of anesthesia    NO RECENT N/V   Finger mass, right    thumb   GERD (gastroesophageal reflux disease)    PONV (postoperative nausea and vomiting)    H/O  NONE WITH RECENT SURGIES     Active Problem List   Patient Active Problem List   Diagnosis Date Noted   Acute pancreatitis 04/18/2023   Pancreatitis 04/18/2023   Cervical spondylosis without myelopathy 01/05/2023     Past Surgical History   Past Surgical History:  Procedure Laterality Date   ABDOMINAL HYSTERECTOMY     BREAST SURGERY     BX/ LUMPECTOMY with sentinel lymph biopsy 2001   CARPAL TUNNEL RELEASE     both hands   CATARACT EXTRACTION W/PHACO Left 10/26/2016   Procedure: CATARACT EXTRACTION PHACO AND INTRAOCULAR LENS PLACEMENT (IOC);  Surgeon: Galen Manila, MD;  Location: [facility] ORS;  Service: Ophthalmology;  Laterality: Left;  [place] 00:43 AP% 12.0 CDE 5.20 Fluid pack lot # [id number]   CATARACT EXTRACTION W/PHACO Right 11/16/2016   Procedure: CATARACT EXTRACTION PHACO AND INTRAOCULAR LENS PLACEMENT (IOC);  Surgeon: Galen Manila, MD;  Location: [facility] ORS;  Service: Ophthalmology;  Laterality: Right;  [place] 00:45.9 AP% 13.4 CDE 6.16 Fluid Pack lot # [id number]   COLONOSCOPY     CTR     DILATION  AND CURETTAGE OF UTERUS     needle core biopsy     TONSILLECTOMY     TUBAL LIGATION       Home Medications   Prior to Admission medications   Medication Sig Start Date End Date Taking? Authorizing Provider  cephALEXin (KEFLEX) 500 MG capsule Take 1 capsule (500 mg total) by mouth 3 (three) times daily. 04/20/23  Yes Irean Hong, MD  ondansetron (ZOFRAN-ODT) 4 MG disintegrating tablet Take 1 tablet (4 mg total) by mouth every 8 (eight) hours as needed for nausea or vomiting. 04/20/23  Yes Irean Hong, MD  oxyCODONE-acetaminophen (PERCOCET/ROXICET) 5-325 MG tablet Take 1 tablet by mouth every 4 (four) hours as needed for severe pain (pain score 7-10). 04/20/23  Yes Irean Hong, MD  acetaminophen (TYLENOL) 500 MG tablet Take 500-1,000 mg by mouth every 8 (eight) hours as needed for mild pain or moderate pain.    [provider]  cyanocobalamin (VITAMIN B12) 1000 MCG/ML injection Inject 1,000 mcg into the muscle every 30 (thirty) days.    [provider]  LORazepam (ATIVAN) 0.5 MG tablet Take 0.5 mg by mouth every 8 (eight) hours as needed for anxiety.    [provider]     Allergies  Augmentin [amoxicillin-pot clavulanate], Avelox [moxifloxacin], Biaxin [clarithromycin], Cefdinir, Clindamycin/lincomycin, Doxycycline, Macrodantin [nitrofurantoin macrocrystal], and Septra [sulfamethoxazole-trimethoprim]   Family History  History  reviewed. No pertinent family history.   Physical Exam  Triage Vital Signs: ED Triage Vitals [04/19/23 2237]  Encounter Vitals Group     BP 129/76     Systolic BP Percentile      Diastolic BP Percentile      Pulse Rate (!) 107     Resp 20     Temp 99.5 F (37.5 C)     Temp Source Oral     SpO2 95 %     Weight      Height      Head Circumference      Peak Flow      Pain Score      Pain Loc      Pain Education      Exclude from Growth Chart     Updated Vital Signs: BP 131/61 (BP Location: Left Arm)   Pulse 94    Temp 99.5 F (37.5 C) (Oral)   Resp 18   SpO2 100%    General: Awake, mild distress.  CV:  RRR.  Good peripheral perfusion.  Resp:  Normal effort.  CTAB. Abd:  Minimal epigastric tenderness to palpation without rebound or guarding.  No distention.  Other:  No truncal vesicles.   ED Results / Procedures / Treatments  Labs (all labs ordered are listed, but only abnormal results are displayed) Labs Reviewed  CBC WITH DIFFERENTIAL/PLATELET - Abnormal; Notable for the following components:      Result Value   Neutro Abs 8.7 (*)    Lymphs Abs 0.6 (*)    All other components within normal limits  COMPREHENSIVE METABOLIC PANEL - Abnormal; Notable for the following components:   Calcium 8.4 (*)    Total Protein 6.3 (*)    All other components within normal limits  LIPASE, BLOOD - Abnormal; Notable for the following components:   Lipase 114 (*)    All other components within normal limits  URINALYSIS, ROUTINE W REFLEX MICROSCOPIC - Abnormal; Notable for the following components:   Color, Urine YELLOW (*)    APPearance HAZY (*)    Hgb urine dipstick SMALL (*)    Ketones, ur 80 (*)    Leukocytes,Ua MODERATE (*)    Non Squamous Epithelial PRESENT (*)    All other components within normal limits  URINE CULTURE     EKG  None   RADIOLOGY  None   Official radiology report(s): No results found.   PROCEDURES:  Critical Care performed: No  .1-3 Lead EKG Interpretation  Performed by: Irean Hong, MD Authorized by: Irean Hong, MD     Interpretation: normal     ECG rate:  98   ECG rate assessment: normal     Rhythm: sinus rhythm     Ectopy: none     Conduction: normal   Comments:     Patient placed on cardiac monitor to evaluate for arrhythmias    MEDICATIONS ORDERED IN ED: Medications  sodium chloride 0.9 % bolus 1,000 mL (0 mLs Intravenous Stopped 04/20/23 0559)  ondansetron (ZOFRAN) injection 4 mg (4 mg Intravenous Given 04/20/23 0325)  morphine (PF) 4  MG/ML injection 4 mg (4 mg Intravenous Given 04/20/23 0326)  cefTRIAXone (ROCEPHIN) 1 g in sodium chloride 0.9 % 100 mL IVPB (0 g Intravenous Stopped 04/20/23 0445)     IMPRESSION / MDM / ASSESSMENT AND PLAN / ED COURSE  I reviewed the triage vital signs and the nursing notes.  72 year old female presenting with abdominal pain, recent hospitalization for pancreatitis. Differential diagnosis includes, but is not limited to, biliary disease (biliary colic, acute cholecystitis, cholangitis, choledocholithiasis, etc), intrathoracic causes for epigastric abdominal pain including ACS, gastritis, duodenitis, pancreatitis, small bowel or large bowel obstruction, abdominal aortic aneurysm, hernia, and ulcer(s).  I personally viewed patient's records and note her hospitalization 12/23 - 04/19/2023.  Patient's presentation is most consistent with acute complicated illness / injury requiring diagnostic workup.  The patient is on the cardiac monitor to evaluate for evidence of arrhythmia and/or significant heart rate changes.  Laboratory results demonstrate normal WBC 10.2, downtrending lipase, moderate leukocyte positive UTI.  Will administer IV fluids, IV morphine for pain, IV Zofran for nausea and reassess.  Appreciate pharmacy consult for antibiotic selection for UTI.  Clinical Course as of 04/20/23 0625  Wed Apr 20, 2023  4098 Patient feeling significantly better.  Denies pain, nausea or vomiting.  Will discharge home on Keflex for UTI, as needed Percocet/Zofran and patient will follow-up with her PCP.  Strict return precautions given.  Patient and daughter verbalized understanding and agree with plan of care. [JS]    Clinical Course User Index [JS] Irean Hong, MD     FINAL CLINICAL IMPRESSION(S) / ED DIAGNOSES   Final diagnoses:  Epigastric pain  Acute pancreatitis without infection or necrosis, unspecified pancreatitis type  Lower urinary tract infectious disease      Rx / DC Orders   ED Discharge Orders          Ordered    cephALEXin (KEFLEX) 500 MG capsule  3 times daily        04/20/23 0522    oxyCODONE-acetaminophen (PERCOCET/ROXICET) 5-325 MG tablet  Every 4 hours PRN        04/20/23 0522    ondansetron (ZOFRAN-ODT) 4 MG disintegrating tablet  Every 8 hours PRN        04/20/23 0522             Note:  This document was prepared using Dragon voice recognition software and may include unintentional dictation errors.   Irean Hong, MD 04/20/23 [phone]

## 2023-04-20 NOTE — Discharge Instructions (Signed)
Take and finish antibiotic as prescribed.  You may take pain and nausea medicines as needed.  Clear liquid diet x 24 hours, then bland diet x 5 days, then slowly advance diet as tolerated.  Return to the ER for worsening symptoms, persistent vomiting, difficulty breathing or other concerns.

## 2023-04-20 NOTE — Telephone Encounter (Signed)
Unable to fill Rxs from last night as no DEA number on hand written Rxs

## 2023-04-21 LAB — URINE CULTURE: Culture: <10000 — AB

## 2023-11-07 ENCOUNTER — Other Ambulatory Visit: Payer: Self-pay | Admitting: Family Medicine

## 2023-11-07 DIAGNOSIS — R1013 Epigastric pain: Secondary | ICD-10-CM

## 2023-11-09 ENCOUNTER — Ambulatory Visit
Admission: RE | Admit: 2023-11-09 | Discharge: 2023-11-09 | Disposition: A | Discharge status: Home / Self Care | Source: Ambulatory Visit | Attending: Family Medicine | Admitting: Family Medicine

## 2023-11-09 DIAGNOSIS — R1013 Epigastric pain: Secondary | ICD-10-CM | POA: Insufficient documentation

## 2023-11-12 DIAGNOSIS — M1711 Unilateral primary osteoarthritis, right knee: Principal | ICD-10-CM | POA: Insufficient documentation

## 2024-01-24 NOTE — Discharge Instructions (Signed)
 Instructions after Total Knee Replacement   Savannah Tiger P. Melrose Kearse, [name], [name]    Dept. of Orthopaedics & Sports Medicine [facility] [address] [place], [place]  [address]  Phone: [phone]   Fax: [phone]       [email or web address]       DIET: Drink plenty of non-alcoholic fluids. Resume your normal diet. Include foods high in fiber.  ACTIVITY:  You may use crutches or a walker with weight-bearing as tolerated, unless instructed otherwise. You may be weaned off of the walker or crutches by your Physical Therapist.  Do NOT place pillows under the knee. Anything placed under the knee could limit your ability to straighten the knee.   Use the Bone Foam 3 times a day for 30 minutes each session to help straighten the knee. Continue doing gentle exercises. Exercising will reduce the pain and swelling, increase motion, and prevent muscle weakness.   Please continue to use the TED compression stockings for 6 weeks. You may remove the stockings at night, but should reapply them in the morning. Do not drive or operate any equipment until instructed.  WOUND CARE:  The initial dressing (Aquacel) can remain in place for 7 days (see separate instructions). Continue to use the PolarCare or ice packs periodically to reduce pain and swelling. You may bathe or shower after the staples are removed at the first office visit following surgery.  MEDICATIONS: You may resume your regular medications. Please take the pain medication as prescribed on the medication. Do not take pain medication on an empty stomach. Unless instructed otherwise, you should take an enteric-coated aspirin  81 mg. TWICE a day. (This along with elevation will help reduce the possibility of blood clots/phlebitis in your operated leg.) Use a stool softener (such as Senokot-S or Colace) daily and a laxative (such as Miralax  or Dulcolax) as needed to prevent constipation.  Do not drive or drink alcoholic beverages when  taking pain medications.  CALL THE OFFICE FOR: Temperature above 101 degrees Excessive bleeding or drainage on the dressing. Excessive swelling, coldness, or paleness of the toes. Persistent nausea and vomiting.  FOLLOW-UP:  You should have an appointment to return to the office in 10-14 days after surgery. Arrangements have been made for continuation of Physical Therapy (either home therapy or outpatient therapy).     [facility] Department Directory         [email or web address]       [email or web address]          Cardiology  Appointments: [place] - [phone]  Endocrinology  Appointments: [place] [phone] [place] - [phone]  Gastroenterology  Appointments: [place] [phone] [place] - [phone]        General Surgery   Appointments: [facility]  Internal Medicine/Family Medicine  Appointments: [facility] - [phone] [place] - [phone]  Metabolic and Weigh Loss Surgery  Appointments: [facility]        Neurology  Appointments: [place] [phone] [place] - [phone]  Neurosurgery  Appointments: [place]  Obstetrics & Gynecology  Appointments: [place] [phone] [place] - [phone]        Pediatrics  Appointments: [place] [phone] [place] - [phone]  Physiatry  Appointments: [place] [phone]  Physical Therapy  Appointments: [place] - [phone]        Podiatry  Appointments: [place] [phone] [place] - [phone]  Pulmonology  Appointments: [place]  Rheumatology  Appointments: [place] [phone]  [place] Location: [facility]  [address] [place], [place]  [address]  [place] Location: [facility]. [address] [place], [place]  [address]  [place]  Location: [facility] [address] [place], [place]  [address]

## 2024-01-24 NOTE — H&P (Signed)
 ORTHOPAEDIC HISTORY & PHYSICAL Drake Fonda [place], [place] - 01/19/2024 10:30 AM EDT Formatting of this note is different from the original. NAME: Savannah Griffin H&P Date: 01/19/2024 Procedure Date: 02/06/2024  Chief Complaint: right knee pain, instability, and swelling  HPI Savannah Griffin is a 73 y.o. female who has severe Right knee pain. Patient reports a longstanding history of right knee discomfort which has progressively gotten worse over the last 5 to 6 months. She states that most of the pain localizes along the anterior and medial aspect of her knee. She states that it is made worse with standing, ambulation, bending of the knee including with squatting or attempting to go up or down stairs. She does report intermittent swelling and giving way of the knee. Denies any locking or maltracking symptoms. It greatly affects her ability to ambulate long distances and perform her ADLs that she would normally like. She has failed conservative treatment including Tylenol , NSAIDs, bracing and activity modification. She is not currently utilizing any ambulatory aids. She has requested operative intervention for relief of her DJD symptoms. She denies any previous cardiac issues. She does report a distant history of a isolated pneumonia infection in 1991. No previous DVTs or clots. She is not a diabetic. No previous surgeries on this knee.  Social Hx: Patient is retired and lives at home alone. She does state that her daughter (who presents with her today) will be helping look after her postsurgically. She denies any alcohol use, illicit drug use, nicotine use or smoking.  Medications & Allergies Allergies: Allergies Allergen Reactions Augmentin [Amoxicillin-Pot Clavulanate] Headache Avelox  [Moxifloxacin ] Vomiting Biaxin [Clarithromycin] Vomiting Cefdinir Headache Clindamycin Headache Doxycycline Headache Escitalopram Other (See Comments) and Headache And shakiness Nitrofurantoin  Unknown Septra [Sulfamethoxazole-Trimethoprim] Headache Sulfa (Sulfonamide Antibiotics) Unknown  Home Medicines: Current Outpatient Medications on File Prior to Visit Medication Sig Dispense Refill acetaminophen  (TYLENOL ) 500 MG tablet Take 500 mg by mouth every 8 (eight) hours as needed for Pain cyanocobalamin (VITAMIN B12) 1,000 mcg/mL injection Inject into the muscle monthly Every week x 4 then monthly diclofenac (VOLTAREN) 1 % topical gel Apply 2 g topically 4 (four) times daily 100 g 1 LORazepam  (ATIVAN ) 0.5 MG tablet TAKE 1 TABLET BY MOUTH DAILY AS NEEDED FOR ANXIETY 30 tablet 0  No current facility-administered medications on file prior to visit.  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 Tried AI therapy but stopped b/c of side effects. 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 BEVERLEY BREAST SURGERY Right 2001 lumpectomy, ENDOSCOPIC CARPAL TUNNEL RELEASE Bilateral 2003 TONSILLECTOMY 2007 COLONOSCOPY 05/16/2006 Hyperplastic Polyp: CBF 04/2016; Recall Ltr mailed 03/17/2016 (dw) CATARACT EXTRACTION Bilateral 10/2016 COLONOSCOPY 07/15/2017 Normal/Repeat in 10 yrs/TKT BREAST BIOPSY BREAST LUMPECTOMY BREAST SURGERY Left 1992, 1997 breast bx   Physical Exam  Ht:157.5 cm (5' 2) Wt:60.4 kg (133 lb 3.2 oz) BMI: Body mass index is 24.36 kg/m.  General/Constitutional: No apparent distress:  well-nourished and well developed. Eyes: Pupils equal, round with synchronous movement. Lymphatic: No palpable adenopathy. Respiratory: Patient has good chest  rise and fall with inspiration and expiration. All lung fields are clear to auscultation bilaterally. There is no Rales, rhonchi or wheezes appreciated. Cardiovascular: Upon auscultation there is a regular rate and rhythm without any murmurs, rubs, gallops or heaves appreciated. There does not appear to be any swelling down the lower extremities. Posterior tibial pulses appreciated bilaterally, 2+. Integumentary: No impressive skin lesions present, except as noted in detailed exam. Neuro/Psych: Normal mood and affect, oriented to person, place and time. Musculoskeletal: see exam below  Right knee exam Upon inspection of the patient's right knee there does not appear to be any skin changes, open abrasions, swelling or redness. There is a slight varus alignment. Upon palpation, the patient reports having pain along the anterior and medial aspect of their knee. Patient has full extension actively with ROM, and able to flex back to 117 degrees with mild pain. Varus and valgus stress testing shows slight laxity to valgus stressing. The patella tracks well within the femoral groove from flexion into extension with noticeable crepitus. Anterior and posterior drawer testing negative. Patient is neurovascularly intact down their lower extremity to all dermatomes. Posterior tibial pulses appreciated 2+.  Imaging right Knee Imaging: A series of x-rays were ordered and interpreted of the patient's right knee. These images included AP weightbearing, lateral and sunrise views. Upon inspection, there is appear to be noticeable joint space closure along the medial cartilage space with approximately 70% joint space narrowing. Sunrise views show 100% patellofemoral joint space narrowing with bone-on-bone articulation appreciated. Subchondral sclerosis is  appreciated. Osteophyte formation is present. No fractures, lytic lesions or gross fomites appreciated on films.  Assesment and Plan Knee DJD  I have recommended that Savannah Griffin undergo right total knee replacement. Consents has been signed. The risks, benefits, prognosis and alternatives including but not limited to DVT, PE, infection, neurovascular injury, failure of the procedure and death were explained to the patient and she is willing to proceed with surgery as described to her by myself. Plan will be for post operative admission of at least 1 midnight for pain control and PT. She will be managed with DVT prophylaxis, antibiotics preoperatively for 24 hours and aggressive in patient rehab.  Pre, intra and post op interventions were discussed. Patient has good understanding  Medication Reconciliation was performed. Discussed cessation of oral and topical NSAIDs, vitamins and supplements.  A total of 40 minutes was spent reviewing patient's charts, medical reconciliation, discussing/educating the patient about surgical interventions, and answering any questions provided by the patient.  JOSHUA DALLAS KOYANAGI, PA [facility] 01/19/2024  Electronically signed by KOYANAGI Fonda DALLAS, PA at 01/19/2024 4:26 PM EDT

## 2024-01-25 ENCOUNTER — Encounter
Admission: RE | Admit: 2024-01-25 | Discharge: 2024-01-25 | Disposition: A | Discharge status: Home / Self Care | Source: Ambulatory Visit | Attending: Orthopedic Surgery | Admitting: Orthopedic Surgery

## 2024-01-25 ENCOUNTER — Other Ambulatory Visit: Payer: Self-pay

## 2024-01-25 VITALS — BP 116/68 | HR 81 | Resp 12 | Ht 62.0 in | Wt 134.5 lb

## 2024-01-25 DIAGNOSIS — Z01818 Encounter for other preprocedural examination: Secondary | ICD-10-CM | POA: Insufficient documentation

## 2024-01-25 DIAGNOSIS — M1711 Unilateral primary osteoarthritis, right knee: Secondary | ICD-10-CM

## 2024-01-25 DIAGNOSIS — Z01812 Encounter for preprocedural laboratory examination: Secondary | ICD-10-CM | POA: Diagnosis present

## 2024-01-25 DIAGNOSIS — Z01810 Encounter for preprocedural cardiovascular examination: Secondary | ICD-10-CM | POA: Diagnosis not present

## 2024-01-25 HISTORY — DX: Family history of other specified conditions: Z84.89

## 2024-01-25 LAB — COMPREHENSIVE METABOLIC PANEL WITH GFR
ALT: 16 U/L (ref 0–44)
AST: 21 U/L (ref 15–41)
Albumin: 3.8 g/dL (ref 3.5–5.0)
Alkaline Phosphatase: 97 U/L (ref 38–126)
Anion gap: 10 (ref 5–15)
BUN: 14 mg/dL (ref 8–23)
CO2: 30 mmol/L (ref 22–32)
Calcium: 9.2 mg/dL (ref 8.9–10.3)
Chloride: 103 mmol/L (ref 98–111)
Creatinine, Ser: 0.69 mg/dL (ref 0.44–1.00)
GFR, Estimated: >60 mL/min (ref >60)
Glucose, Bld: 98 mg/dL (ref 70–99)
Potassium: 3.8 mmol/L (ref 3.5–5.1)
Sodium: 143 mmol/L (ref 135–145)
Total Bilirubin: 0.8 mg/dL (ref 0.0–1.2)
Total Protein: 6.6 g/dL (ref 6.5–8.1)

## 2024-01-25 LAB — CBC
HCT: 43.5 % (ref 36.0–46.0)
Hemoglobin: 14.7 g/dL (ref 12.0–15.0)
MCH: 30.6 pg (ref 26.0–34.0)
MCHC: 33.8 g/dL (ref 30.0–36.0)
MCV: 90.6 fL (ref 80.0–100.0)
Platelets: 205 K/uL (ref 150–400)
RBC: 4.8 MIL/uL (ref 3.87–5.11)
RDW: 12.3 % (ref 11.5–15.5)
WBC: 6.7 K/uL (ref 4.0–10.5)
nRBC: 0 % (ref 0.0–0.2)

## 2024-01-25 LAB — URINALYSIS, ROUTINE W REFLEX MICROSCOPIC
Bilirubin Urine: NEGATIVE
Glucose, UA: NEGATIVE mg/dL
Hgb urine dipstick: NEGATIVE
Ketones, ur: NEGATIVE mg/dL
Leukocytes,Ua: NEGATIVE
Nitrite: NEGATIVE
Protein, ur: NEGATIVE mg/dL
Specific Gravity, Urine: 1.025 (ref 1.005–1.030)
pH: 5 (ref 5.0–8.0)

## 2024-01-25 LAB — C-REACTIVE PROTEIN: CRP: <0.5 mg/dL (ref <1.0)

## 2024-01-25 LAB — SURGICAL PCR SCREEN
MRSA, PCR: NEGATIVE
Staphylococcus aureus: NEGATIVE

## 2024-01-25 LAB — SEDIMENTATION RATE: Sed Rate: 2 mm/h (ref 0–30)

## 2024-01-25 NOTE — Patient Instructions (Addendum)
 Your procedure is scheduled on:02-06-24 Monday Report to the Registration Desk on the 1st floor of the [facility].Then proceed to the 2nd floor Surgery Desk To find out your arrival time, please call [phone] between 1PM - 3PM on:02-03-24 Friday If your arrival time is 6:00 am, do not arrive before that time as the [facility] entrance doors do not open until 6:00 am.  REMEMBER: Instructions that are not followed completely may result in serious medical risk, up to and including death; or upon the discretion of your surgeon and anesthesiologist your surgery may need to be rescheduled.  Do not eat food after midnight the night before surgery.  No gum chewing or hard candies.  You may however, drink CLEAR liquids up to 2 hours before you are scheduled to arrive for your surgery. Do not drink anything within 2 hours of your scheduled arrival time.  Clear liquids include: - water  - apple juice without pulp - gatorade (not RED colors) - black coffee or tea (Do NOT add milk or creamers to the coffee or tea) Do NOT drink anything that is not on this list.  In addition, your doctor has ordered for you to drink the provided:  Ensure Pre-Surgery Clear Carbohydrate Drink  Drinking this carbohydrate drink up to two hours before surgery helps to reduce insulin resistance and improve patient outcomes. Please complete drinking 2 hours before scheduled arrival time.  One week prior to surgery:Last dose will be on 01-29-24  Stop Anti-inflammatories (NSAIDS) such as Advil, Aleve, Ibuprofen, Motrin, Naproxen, Naprosyn and Aspirin based products such as Excedrin, Goody's Powder, BC Powder. Stop ANY OVER THE COUNTER supplements until after surgery.  You may however, continue to take Tylenol  if needed for pain up until the day of surgery  Continue taking all of your other prescription medications up until the day of surgery.  ON THE DAY OF SURGERY ONLY TAKE THESE MEDICATIONS WITH SIPS OF  WATER: -You may take LORazepam  (ATIVAN ) if needed for anxiety  No Alcohol for 24 hours before or after surgery.  No Smoking including e-cigarettes for 24 hours before surgery.  No chewable tobacco products for at least 6 hours before surgery.  No nicotine patches on the day of surgery.  Do not use any recreational drugs for at least a week (preferably 2 weeks) before your surgery.  Please be advised that the combination of cocaine and anesthesia may have negative outcomes, up to and including death. If you test positive for cocaine, your surgery will be cancelled.  On the morning of surgery brush your teeth with toothpaste and water, you may rinse your mouth with mouthwash if you wish. Do not swallow any toothpaste or mouthwash.  Use CHG Soap as directed on instruction sheet.  Do not wear jewelry, make-up, hairpins, clips or nail polish.  For welded (permanent) jewelry: bracelets, anklets, waist bands, etc.  Please have this removed prior to surgery.  If it is not removed, there is a chance that hospital personnel will need to cut it off on the day of surgery.  Do not wear lotions, powders, or perfumes.   Do not shave body hair from the neck down 48 hours before surgery.  Contact lenses, hearing aids and dentures may not be worn into surgery.  Do not bring valuables to the hospital. [facility] is not responsible for any missing/lost belongings or valuables.   Notify your doctor if there is any change in your medical condition (cold, fever, infection).  Wear comfortable  clothing (specific to your surgery type) to the hospital.  After surgery, you can help prevent lung complications by doing breathing exercises.  Take deep breaths and cough every 1-2 hours. Your doctor may order a device called an Incentive Spirometer to help you take deep breaths. When coughing or sneezing, hold a pillow firmly against your incision with both hands. This is called "splinting." Doing this helps  protect your incision. It also decreases belly discomfort.  If you are being admitted to the hospital overnight, leave your suitcase in the car. After surgery it may be brought to your room.  In case of increased patient census, it may be necessary for you, the patient, to continue your postoperative care in the Same Day Surgery department.  If you are being discharged the day of surgery, you will not be allowed to drive home. You will need a responsible individual to drive you home and stay with you for 24 hours after surgery.   If you are taking public transportation, you will need to have a responsible individual with you.  Please call the Pre-admissions Testing Dept. at [phone] if you have any questions about these instructions.  Surgery Visitation Policy:  Patients having surgery or a procedure may have two visitors.  Children under the age of 30 must have an adult with them who is not the patient.  Inpatient Visitation:    Visiting hours are 7 a.m. to 8 p.m. Up to four visitors are allowed at one time in a patient room. The visitors may rotate out with other people during the day.  One visitor age 67 or older may stay with the patient overnight and must be in the room by 8 p.m.    Pre-operative 5 CHG Bath Instructions   You can play a key role in reducing the risk of infection after surgery. Your skin needs to be as free of germs as possible. You can reduce the number of germs on your skin by washing with CHG (chlorhexidine gluconate) soap before surgery. CHG is an antiseptic soap that kills germs and continues to kill germs even after washing.   DO NOT use if you have an allergy to chlorhexidine/CHG or antibacterial soaps. If your skin becomes reddened or irritated, stop using the CHG and notify one of our RNs at [phone].   Please shower with the CHG soap starting 4 days before surgery using the following schedule:     Please keep in mind the following:  DO NOT  shave, including legs and underarms, starting the day of your first shower.   You may shave your face at any point before/day of surgery.  Place clean sheets on your bed the day you start using CHG soap. Use a clean washcloth (not used since being washed) for each shower. DO NOT sleep with pets once you start using the CHG.   CHG Shower Instructions:  If you choose to wash your hair and private area, wash first with your normal shampoo/soap.  After you use shampoo/soap, rinse your hair and body thoroughly to remove shampoo/soap residue.  Turn the water OFF and apply about 3 tablespoons (45 ml) of CHG soap to a CLEAN washcloth.  Apply CHG soap ONLY FROM YOUR NECK DOWN TO YOUR TOES (washing for 3-5 minutes)  DO NOT use CHG soap on face, private areas, open wounds, or sores.  Pay special attention to the area where your surgery is being performed.  If you are having back surgery, having someone wash  your back for you may be helpful. Wait 2 minutes after CHG soap is applied, then you may rinse off the CHG soap.  Pat dry with a clean towel  Put on clean clothes/pajamas   If you choose to wear lotion, please use ONLY the CHG-compatible lotions on the back of this paper.     Additional instructions for the day of surgery: DO NOT APPLY any lotions, deodorants, cologne, or perfumes.   Put on clean/comfortable clothes.  Brush your teeth.  Ask your nurse before applying any prescription medications to the skin.      CHG Compatible Lotions   Aveeno Moisturizing lotion  Cetaphil Moisturizing Cream  Cetaphil Moisturizing Lotion  Clairol Herbal Essence Moisturizing Lotion, Dry Skin  Clairol Herbal Essence Moisturizing Lotion, Extra Dry Skin  Clairol Herbal Essence Moisturizing Lotion, Normal Skin  Curel Age Defying Therapeutic Moisturizing Lotion with Alpha Hydroxy  Curel Extreme Care Body Lotion  Curel Soothing Hands Moisturizing Hand Lotion  Curel Therapeutic Moisturizing Cream,  Fragrance-Free  Curel Therapeutic Moisturizing Lotion, Fragrance-Free  Curel Therapeutic Moisturizing Lotion, Original Formula  Eucerin Daily Replenishing Lotion  Eucerin Dry Skin Therapy Plus Alpha Hydroxy Crme  Eucerin Dry Skin Therapy Plus Alpha Hydroxy Lotion  Eucerin Original Crme  Eucerin Original Lotion  Eucerin Plus Crme Eucerin Plus Lotion  Eucerin TriLipid Replenishing Lotion  Keri Anti-Bacterial Hand Lotion  Keri Deep Conditioning Original Lotion Dry Skin Formula Softly Scented  Keri Deep Conditioning Original Lotion, Fragrance Free Sensitive Skin Formula  Keri Lotion Fast Absorbing Fragrance Free Sensitive Skin Formula  Keri Lotion Fast Absorbing Softly Scented Dry Skin Formula  Keri Original Lotion  Keri Skin Renewal Lotion Keri Silky Smooth Lotion  Keri Silky Smooth Sensitive Skin Lotion  Nivea Body Creamy Conditioning Oil  Nivea Body Extra Enriched Lotion  Nivea Body Original Lotion  Nivea Body Sheer Moisturizing Lotion Nivea Crme  Nivea Skin Firming Lotion  NutraDerm 30 Skin Lotion  NutraDerm Skin Lotion  NutraDerm Therapeutic Skin Cream  NutraDerm Therapeutic Skin Lotion  ProShield Protective Hand Cream  Provon moisturizing lotion  How to Use an Incentive Spirometer An incentive spirometer is a tool that measures how well you are filling your lungs with each breath. Learning to take long, deep breaths using this tool can help you keep your lungs clear and active. This may help to reverse or lessen your chance of developing breathing (pulmonary) problems, especially infection. You may be asked to use a spirometer: After a surgery. If you have a lung problem or a history of smoking. After a long period of time when you have been unable to move or be active. If the spirometer includes an indicator to show the highest number that you have reached, your health care provider or respiratory therapist will help you set a goal. Keep a log of your progress as told by  your health care provider. What are the risks? Breathing too quickly may cause dizziness or cause you to pass out. Take your time so you do not get dizzy or light-headed. If you are in pain, you may need to take pain medicine before doing incentive spirometry. It is harder to take a deep breath if you are having pain. How to use your incentive spirometer  Sit up on the edge of your bed or on a chair. Hold the incentive spirometer so that it is in an upright position. Before you use the spirometer, breathe out normally. Place the mouthpiece in your mouth. Make sure your lips are closed  tightly around it. Breathe in slowly and as deeply as you can through your mouth, causing the piston or the ball to rise toward the top of the chamber. Hold your breath for 3-5 seconds, or for as long as possible. If the spirometer includes a coach indicator, use this to guide you in breathing. Slow down your breathing if the indicator goes above the marked areas. Remove the mouthpiece from your mouth and breathe out normally. The piston or ball will return to the bottom of the chamber. Rest for a few seconds, then repeat the steps 10 or more times. Take your time and take a few normal breaths between deep breaths so that you do not get dizzy or light-headed. Do this every 1-2 hours when you are awake. If the spirometer includes a goal marker to show the highest number you have reached (best effort), use this as a goal to work toward during each repetition. After each set of 10 deep breaths, cough a few times. This will help to make sure that your lungs are clear. If you have an incision on your chest or abdomen from surgery, place a pillow or a rolled-up towel firmly against the incision when you cough. This can help to reduce pain while taking deep breaths and coughing. General tips When you are able to get out of bed: Walk around often. Continue to take deep breaths and cough in order to clear your  lungs. Keep using the incentive spirometer until your health care provider says it is okay to stop using it. If you have been in the hospital, you may be told to keep using the spirometer at home. Contact a health care provider if: You are having difficulty using the spirometer. You have trouble using the spirometer as often as instructed. Your pain medicine is not giving enough relief for you to use the spirometer as told. You have a fever. Get help right away if: You develop shortness of breath. You develop a cough with bloody mucus from the lungs. You have fluid or blood coming from an incision site after you cough. Summary An incentive spirometer is a tool that can help you learn to take long, deep breaths to keep your lungs clear and active. You may be asked to use a spirometer after a surgery, if you have a lung problem or a history of smoking, or if you have been inactive for a long period of time. Use your incentive spirometer as instructed every 1-2 hours while you are awake. If you have an incision on your chest or abdomen, place a pillow or a rolled-up towel firmly against your incision when you cough. This will help to reduce pain. Get help right away if you have shortness of breath, you cough up bloody mucus, or blood comes from your incision when you cough. This information is not intended to replace advice given to you by your health care provider. Make sure you discuss any questions you have with your health care provider. Document Revised: 02/18/2023 Document Reviewed: 02/18/2023 Elsevier Patient Education  2024 Elsevier Inc.     Preoperative Educational Videos for Total Hip, Knee and Shoulder Replacements  To better prepare for surgery, please view our videos that explain the physical activity and discharge planning required to have the best surgical recovery at [facility].  [email or web address]  Questions? Call [phone] or email [email or web address]       Community Resource Directory to address health-related social needs:  [email or web address]

## 2024-02-05 ENCOUNTER — Encounter: Payer: Self-pay | Admitting: Orthopedic Surgery

## 2024-02-05 LAB — COLOGUARD: COLOGUARD: NEGATIVE

## 2024-02-06 ENCOUNTER — Encounter: Payer: Self-pay | Admitting: Orthopedic Surgery

## 2024-02-06 ENCOUNTER — Other Ambulatory Visit: Payer: Self-pay

## 2024-02-06 ENCOUNTER — Ambulatory Visit: Payer: Self-pay | Admitting: Urgent Care

## 2024-02-06 ENCOUNTER — Ambulatory Visit

## 2024-02-06 ENCOUNTER — Encounter
Admission: RE | Disposition: A | Discharge status: Home / Self Care | Payer: Self-pay | Source: Ambulatory Visit | Attending: Orthopedic Surgery

## 2024-02-06 ENCOUNTER — Observation Stay

## 2024-02-06 ENCOUNTER — Observation Stay
Admission: RE | Admit: 2024-02-06 | Discharge: 2024-02-07 | Disposition: A | Source: Ambulatory Visit | Attending: Orthopedic Surgery | Admitting: Orthopedic Surgery

## 2024-02-06 DIAGNOSIS — M1711 Unilateral primary osteoarthritis, right knee: Secondary | ICD-10-CM | POA: Diagnosis not present

## 2024-02-06 DIAGNOSIS — Z7982 Long term (current) use of aspirin: Secondary | ICD-10-CM | POA: Diagnosis not present

## 2024-02-06 DIAGNOSIS — Z853 Personal history of malignant neoplasm of breast: Secondary | ICD-10-CM | POA: Insufficient documentation

## 2024-02-06 DIAGNOSIS — Z96651 Presence of right artificial knee joint: Secondary | ICD-10-CM

## 2024-02-06 DIAGNOSIS — M25561 Pain in right knee: Secondary | ICD-10-CM | POA: Diagnosis present

## 2024-02-06 HISTORY — PX: KNEE ARTHROPLASTY: SHX992

## 2024-02-06 SURGERY — ARTHROPLASTY, KNEE, TOTAL, USING IMAGELESS COMPUTER-ASSISTED NAVIGATION
Anesthesia: Spinal | Site: Knee | Laterality: Right

## 2024-02-06 MED ORDER — TRAMADOL HCL 50 MG PO TABS
50.0000 mg | ORAL_TABLET | ORAL | Status: DC | PRN
  Administered 2024-02-06: 100 mg via ORAL
  Administered 2024-02-07 (×2): 50 mg via ORAL
  Filled 2024-02-06 (×2): qty 2
  Filled 2024-02-06 (×3): qty 1

## 2024-02-06 MED ORDER — ONDANSETRON HCL 4 MG/2ML IJ SOLN
INTRAMUSCULAR | Status: DC | PRN
Start: 2024-02-06 — End: 2024-02-06
  Administered 2024-02-06: 4 mg via INTRAVENOUS

## 2024-02-06 MED ORDER — BUPIVACAINE HCL (PF) 0.25 % IJ SOLN
INTRAMUSCULAR | Status: AC
  Filled 2024-02-06: qty 60

## 2024-02-06 MED ORDER — CHLORHEXIDINE GLUCONATE 4 % EX SOLN
60.0000 mL | Freq: Once | CUTANEOUS | Status: AC
  Administered 2024-02-06: 4 via TOPICAL

## 2024-02-06 MED ORDER — FENTANYL CITRATE (PF) 100 MCG/2ML IJ SOLN
INTRAMUSCULAR | Status: AC
  Filled 2024-02-06: qty 2

## 2024-02-06 MED ORDER — PROPOFOL 500 MG/50ML IV EMUL
INTRAVENOUS | Status: DC | PRN
  Administered 2024-02-06: 100 ug/kg/min via INTRAVENOUS

## 2024-02-06 MED ORDER — OXYCODONE HCL 5 MG PO TABS
ORAL_TABLET | ORAL | Status: AC
  Filled 2024-02-06: qty 1

## 2024-02-06 MED ORDER — SODIUM CHLORIDE 0.9 % IR SOLN
Status: DC | PRN
  Administered 2024-02-06: 3000 mL

## 2024-02-06 MED ORDER — PROPOFOL 1000 MG/100ML IV EMUL
INTRAVENOUS | Status: AC
  Filled 2024-02-06: qty 100

## 2024-02-06 MED ORDER — MENTHOL 3 MG MT LOZG: 1.0000 | LOZENGE | OROMUCOSAL | Status: DC | PRN

## 2024-02-06 MED ORDER — SURGIPHOR WOUND IRRIGATION SYSTEM - OPTIME
TOPICAL | Status: DC | PRN
  Administered 2024-02-06: 450 mL

## 2024-02-06 MED ORDER — CELECOXIB 200 MG PO CAPS
400.0000 mg | ORAL_CAPSULE | Freq: Once | ORAL | Status: AC
  Administered 2024-02-06: 400 mg via ORAL

## 2024-02-06 MED ORDER — HYDROMORPHONE HCL 1 MG/ML IJ SOLN: 0.5000 mg | INTRAMUSCULAR | Status: DC | PRN

## 2024-02-06 MED ORDER — ORAL CARE MOUTH RINSE: 15.0000 mL | Freq: Once | OROMUCOSAL | Status: AC

## 2024-02-06 MED ORDER — LIDOCAINE HCL (PF) 2 % IJ SOLN
INTRAMUSCULAR | Status: AC
  Filled 2024-02-06: qty 5

## 2024-02-06 MED ORDER — PHENYLEPHRINE 80 MCG/ML (10ML) SYRINGE FOR IV PUSH (FOR BLOOD PRESSURE SUPPORT)
PREFILLED_SYRINGE | INTRAVENOUS | Status: DC | PRN
  Administered 2024-02-06 (×2): 80 ug via INTRAVENOUS

## 2024-02-06 MED ORDER — BUPIVACAINE HCL (PF) 0.5 % IJ SOLN
INTRAMUSCULAR | Status: AC
  Filled 2024-02-06: qty 10

## 2024-02-06 MED ORDER — ENSURE PRE-SURGERY PO LIQD
296.0000 mL | Freq: Once | ORAL | Status: DC
  Filled 2024-02-06: qty 296

## 2024-02-06 MED ORDER — ONDANSETRON HCL 4 MG/2ML IJ SOLN
4.0000 mg | Freq: Four times a day (QID) | INTRAMUSCULAR | Status: DC | PRN
  Administered 2024-02-06 – 2024-02-07 (×2): 4 mg via INTRAVENOUS
  Filled 2024-02-06 (×2): qty 2

## 2024-02-06 MED ORDER — CHLORHEXIDINE GLUCONATE 0.12 % MT SOLN
OROMUCOSAL | Status: AC
  Filled 2024-02-06: qty 15

## 2024-02-06 MED ORDER — LIDOCAINE HCL (CARDIAC) PF 100 MG/5ML IV SOSY
PREFILLED_SYRINGE | INTRAVENOUS | Status: DC | PRN
  Administered 2024-02-06: 40 mg via INTRAVENOUS

## 2024-02-06 MED ORDER — ALUM & MAG HYDROXIDE-SIMETH 200-200-20 MG/5ML PO SUSP: 30.0000 mL | ORAL | Status: DC | PRN

## 2024-02-06 MED ORDER — DROPERIDOL 2.5 MG/ML IJ SOLN: 0.6250 mg | Freq: Once | INTRAMUSCULAR | Status: DC | PRN

## 2024-02-06 MED ORDER — ONDANSETRON HCL 4 MG/2ML IJ SOLN
INTRAMUSCULAR | Status: AC
  Filled 2024-02-06: qty 2

## 2024-02-06 MED ORDER — ACETAMINOPHEN 10 MG/ML IV SOLN
INTRAVENOUS | Status: AC
  Filled 2024-02-06: qty 100

## 2024-02-06 MED ORDER — DEXAMETHASONE SOD PHOSPHATE PF 10 MG/ML IJ SOLN
INTRAMUSCULAR | Status: DC | PRN
  Administered 2024-02-06: 5 mg via INTRAVENOUS

## 2024-02-06 MED ORDER — ACETAMINOPHEN 10 MG/ML IV SOLN
1000.0000 mg | Freq: Four times a day (QID) | INTRAVENOUS | Status: DC
  Administered 2024-02-06 – 2024-02-07 (×3): 1000 mg via INTRAVENOUS
  Filled 2024-02-06 (×3): qty 100

## 2024-02-06 MED ORDER — CHLORHEXIDINE GLUCONATE 0.12 % MT SOLN
15.0000 mL | Freq: Once | OROMUCOSAL | Status: AC
  Administered 2024-02-06: 15 mL via OROMUCOSAL

## 2024-02-06 MED ORDER — CEFAZOLIN SODIUM-DEXTROSE 2-4 GM/100ML-% IV SOLN
2.0000 g | Freq: Four times a day (QID) | INTRAVENOUS | Status: AC
  Administered 2024-02-06 (×2): 2 g via INTRAVENOUS
  Filled 2024-02-06: qty 100

## 2024-02-06 MED ORDER — PANTOPRAZOLE SODIUM 40 MG PO TBEC
40.0000 mg | DELAYED_RELEASE_TABLET | Freq: Two times a day (BID) | ORAL | Status: DC
  Administered 2024-02-06 – 2024-02-07 (×3): 40 mg via ORAL
  Filled 2024-02-06 (×3): qty 1

## 2024-02-06 MED ORDER — CELECOXIB 200 MG PO CAPS
ORAL_CAPSULE | ORAL | Status: AC
  Filled 2024-02-06: qty 2

## 2024-02-06 MED ORDER — BISACODYL 10 MG RE SUPP: 10.0000 mg | Freq: Every day | RECTAL | Status: DC | PRN

## 2024-02-06 MED ORDER — GABAPENTIN 300 MG PO CAPS
300.0000 mg | ORAL_CAPSULE | Freq: Once | ORAL | Status: AC
  Administered 2024-02-06: 300 mg via ORAL

## 2024-02-06 MED ORDER — OXYCODONE HCL 5 MG PO TABS
5.0000 mg | ORAL_TABLET | ORAL | Status: DC | PRN
  Administered 2024-02-06 – 2024-02-07 (×5): 5 mg via ORAL
  Filled 2024-02-06 (×4): qty 1

## 2024-02-06 MED ORDER — TRANEXAMIC ACID-NACL 1000-0.7 MG/100ML-% IV SOLN
INTRAVENOUS | Status: AC
  Filled 2024-02-06: qty 100

## 2024-02-06 MED ORDER — BUPIVACAINE LIPOSOME 1.3 % IJ SUSP
INTRAMUSCULAR | Status: AC
  Filled 2024-02-06: qty 20

## 2024-02-06 MED ORDER — PROPOFOL 10 MG/ML IV BOLUS
INTRAVENOUS | Status: DC | PRN
  Administered 2024-02-06: 50 mg via INTRAVENOUS

## 2024-02-06 MED ORDER — SODIUM CHLORIDE 0.9 % IV SOLN: INTRAVENOUS | Status: DC

## 2024-02-06 MED ORDER — ACETAMINOPHEN 325 MG PO TABS: 325.0000 mg | ORAL_TABLET | Freq: Four times a day (QID) | ORAL | Status: DC | PRN

## 2024-02-06 MED ORDER — TRANEXAMIC ACID-NACL 1000-0.7 MG/100ML-% IV SOLN
1000.0000 mg | Freq: Once | INTRAVENOUS | Status: AC
  Administered 2024-02-06: 1000 mg via INTRAVENOUS

## 2024-02-06 MED ORDER — DEXAMETHASONE SOD PHOSPHATE PF 10 MG/ML IJ SOLN
8.0000 mg | Freq: Once | INTRAMUSCULAR | Status: AC
  Administered 2024-02-06: 8 mg via INTRAVENOUS

## 2024-02-06 MED ORDER — OXYCODONE HCL 5 MG/5ML PO SOLN: 5.0000 mg | Freq: Once | ORAL | Status: AC | PRN

## 2024-02-06 MED ORDER — DIPHENHYDRAMINE HCL 12.5 MG/5ML PO ELIX: 12.5000 mg | ORAL_SOLUTION | ORAL | Status: DC | PRN

## 2024-02-06 MED ORDER — FLEET ENEMA RE ENEM: 1.0000 | ENEMA | Freq: Once | RECTAL | Status: DC | PRN

## 2024-02-06 MED ORDER — METOCLOPRAMIDE HCL 10 MG PO TABS
10.0000 mg | ORAL_TABLET | Freq: Three times a day (TID) | ORAL | Status: DC
  Administered 2024-02-06 – 2024-02-07 (×3): 10 mg via ORAL
  Filled 2024-02-06 (×4): qty 1

## 2024-02-06 MED ORDER — MIDAZOLAM HCL 5 MG/5ML IJ SOLN
INTRAMUSCULAR | Status: DC | PRN
  Administered 2024-02-06: 2 mg via INTRAVENOUS

## 2024-02-06 MED ORDER — ACETAMINOPHEN 10 MG/ML IV SOLN: 1000.0000 mg | Freq: Once | INTRAVENOUS | Status: DC | PRN

## 2024-02-06 MED ORDER — METOCLOPRAMIDE HCL 10 MG PO TABS
ORAL_TABLET | ORAL | Status: AC
  Filled 2024-02-06: qty 1

## 2024-02-06 MED ORDER — FENTANYL CITRATE (PF) 100 MCG/2ML IJ SOLN
INTRAMUSCULAR | Status: DC | PRN
  Administered 2024-02-06: 25 ug via INTRAVENOUS

## 2024-02-06 MED ORDER — CEFAZOLIN SODIUM-DEXTROSE 2-4 GM/100ML-% IV SOLN
INTRAVENOUS | Status: AC
  Filled 2024-02-06: qty 100

## 2024-02-06 MED ORDER — PHENYLEPHRINE 80 MCG/ML (10ML) SYRINGE FOR IV PUSH (FOR BLOOD PRESSURE SUPPORT)
PREFILLED_SYRINGE | INTRAVENOUS | Status: AC
  Filled 2024-02-06: qty 10

## 2024-02-06 MED ORDER — LORAZEPAM 0.5 MG PO TABS: 0.5000 mg | ORAL_TABLET | Freq: Three times a day (TID) | ORAL | Status: DC | PRN

## 2024-02-06 MED ORDER — ASPIRIN 81 MG PO CHEW: 81.0000 mg | CHEWABLE_TABLET | Freq: Two times a day (BID) | ORAL | Status: DC

## 2024-02-06 MED ORDER — FENTANYL CITRATE (PF) 100 MCG/2ML IJ SOLN
25.0000 ug | INTRAMUSCULAR | Status: AC | PRN
  Administered 2024-02-06 (×2): 25 ug via INTRAVENOUS
  Administered 2024-02-06 (×2): 50 ug via INTRAVENOUS
  Administered 2024-02-06 (×2): 25 ug via INTRAVENOUS

## 2024-02-06 MED ORDER — PHENYLEPHRINE HCL-NACL 20-0.9 MG/250ML-% IV SOLN
INTRAVENOUS | Status: DC | PRN
  Administered 2024-02-06: 20 ug/min via INTRAVENOUS

## 2024-02-06 MED ORDER — LACTATED RINGERS IV SOLN: INTRAVENOUS | Status: DC

## 2024-02-06 MED ORDER — SODIUM CHLORIDE (PF) 0.9 % IJ SOLN
INTRAMUSCULAR | Status: AC
  Filled 2024-02-06: qty 40

## 2024-02-06 MED ORDER — OXYCODONE HCL 5 MG PO TABS
5.0000 mg | ORAL_TABLET | Freq: Once | ORAL | Status: AC | PRN
  Administered 2024-02-06: 5 mg via ORAL

## 2024-02-06 MED ORDER — DEXMEDETOMIDINE HCL IN NACL 80 MCG/20ML IV SOLN
INTRAVENOUS | Status: AC
  Filled 2024-02-06: qty 20

## 2024-02-06 MED ORDER — ACETAMINOPHEN 10 MG/ML IV SOLN
INTRAVENOUS | Status: DC | PRN
  Administered 2024-02-06: 1000 mg via INTRAVENOUS

## 2024-02-06 MED ORDER — TRANEXAMIC ACID-NACL 1000-0.7 MG/100ML-% IV SOLN
1000.0000 mg | INTRAVENOUS | Status: AC
  Administered 2024-02-06: 1000 mg via INTRAVENOUS

## 2024-02-06 MED ORDER — BUPIVACAINE HCL (PF) 0.5 % IJ SOLN
INTRAMUSCULAR | Status: DC | PRN
Start: 2024-02-06 — End: 2024-02-06
  Administered 2024-02-06: 3 mL

## 2024-02-06 MED ORDER — MAGNESIUM HYDROXIDE 400 MG/5ML PO SUSP
30.0000 mL | Freq: Every day | ORAL | Status: DC
  Filled 2024-02-06: qty 30

## 2024-02-06 MED ORDER — CELECOXIB 200 MG PO CAPS
200.0000 mg | ORAL_CAPSULE | Freq: Two times a day (BID) | ORAL | Status: DC
  Administered 2024-02-06 – 2024-02-07 (×2): 200 mg via ORAL
  Filled 2024-02-06 (×2): qty 1

## 2024-02-06 MED ORDER — SODIUM CHLORIDE (PF) 0.9 % IJ SOLN
INTRAMUSCULAR | Status: DC | PRN
  Administered 2024-02-06: 120 mL

## 2024-02-06 MED ORDER — FERROUS SULFATE 325 (65 FE) MG PO TABS
325.0000 mg | ORAL_TABLET | Freq: Two times a day (BID) | ORAL | Status: DC
  Administered 2024-02-06 – 2024-02-07 (×2): 325 mg via ORAL
  Filled 2024-02-06 (×2): qty 1

## 2024-02-06 MED ORDER — ONDANSETRON HCL 4 MG PO TABS: 4.0000 mg | ORAL_TABLET | Freq: Four times a day (QID) | ORAL | Status: DC | PRN

## 2024-02-06 MED ORDER — CEFAZOLIN SODIUM-DEXTROSE 2-4 GM/100ML-% IV SOLN
2.0000 g | INTRAVENOUS | Status: AC
  Administered 2024-02-06: 2 g via INTRAVENOUS

## 2024-02-06 MED ORDER — GABAPENTIN 300 MG PO CAPS
ORAL_CAPSULE | ORAL | Status: AC
  Filled 2024-02-06: qty 1

## 2024-02-06 MED ORDER — PHENYLEPHRINE HCL-NACL 20-0.9 MG/250ML-% IV SOLN
INTRAVENOUS | Status: AC
  Filled 2024-02-06: qty 250

## 2024-02-06 MED ORDER — PHENOL 1.4 % MT LIQD: 1.0000 | OROMUCOSAL | Status: DC | PRN

## 2024-02-06 MED ORDER — OXYCODONE HCL 5 MG PO TABS: 10.0000 mg | ORAL_TABLET | ORAL | Status: DC | PRN

## 2024-02-06 MED ORDER — SENNOSIDES-DOCUSATE SODIUM 8.6-50 MG PO TABS
1.0000 | ORAL_TABLET | Freq: Two times a day (BID) | ORAL | Status: DC
  Administered 2024-02-06 – 2024-02-07 (×2): 1 via ORAL
  Filled 2024-02-06 (×2): qty 1

## 2024-02-06 MED ORDER — PROPOFOL 10 MG/ML IV BOLUS
INTRAVENOUS | Status: AC
  Filled 2024-02-06: qty 20

## 2024-02-06 MED ORDER — MIDAZOLAM HCL 2 MG/2ML IJ SOLN
INTRAMUSCULAR | Status: AC
  Filled 2024-02-06: qty 2

## 2024-02-06 SURGICAL SUPPLY — 64 items
ATTUNE MED DOME PAT 32 KNEE (Knees) IMPLANT
ATTUNE PSFEM RTSZ5 NARCEM KNEE (Femur) IMPLANT
ATTUNE PSRP INSR SZ5 5 KNEE (Insert) IMPLANT
BASE TIBIAL ROT PLAT SZ 3 KNEE (Knees) IMPLANT
BATTERY INSTRU NAVIGATION (MISCELLANEOUS) ×4 IMPLANT
BIT DRILL QUICK REL 1/8 2PK SL (BIT) ×1 IMPLANT
BLADE CLIPPER SURG (BLADE) IMPLANT
BLADE SAW 70X12.5 (BLADE) ×1 IMPLANT
BLADE SAW 90X13X1.19 OSCILLAT (BLADE) ×1 IMPLANT
BLADE SAW 90X25X1.19 OSCILLAT (BLADE) ×1 IMPLANT
BRUSH SCRUB EZ PLAIN DRY (MISCELLANEOUS) ×1 IMPLANT
CEMENT HV SMART SET (Cement) IMPLANT
COOLER ICEMAN CLASSIC (MISCELLANEOUS) ×1 IMPLANT
CUFF TRNQT CYL 24X4X16.5-23 (TOURNIQUET CUFF) IMPLANT
CUFF TRNQT CYL 30X4X21-28X (TOURNIQUET CUFF) IMPLANT
DRAPE SHEET LG 3/4 BI-LAMINATE (DRAPES) ×1 IMPLANT
DRSG AQUACEL AG ADV 3.5X14 (GAUZE/BANDAGES/DRESSINGS) ×1 IMPLANT
DRSG MEPILEX SACRM 8.7X9.8 (GAUZE/BANDAGES/DRESSINGS) ×1 IMPLANT
DRSG TEGADERM 4X4.75 (GAUZE/BANDAGES/DRESSINGS) ×1 IMPLANT
DURAPREP 26ML APPLICATOR (WOUND CARE) ×2 IMPLANT
ELECT CAUTERY BLADE 6.4 (BLADE) ×1 IMPLANT
ELECTRODE REM PT RTRN 9FT ADLT (ELECTROSURGICAL) ×1 IMPLANT
EVACUATOR 1/8 PVC DRAIN (DRAIN) ×1 IMPLANT
EX-PIN ORTHOLOCK NAV 4X150 (PIN) ×2 IMPLANT
GAUZE XEROFORM 1X8 LF (GAUZE/BANDAGES/DRESSINGS) ×1 IMPLANT
GLOVE BIOGEL M STRL SZ7.5 (GLOVE) ×6 IMPLANT
GLOVE BIOGEL PI IND STRL 8 (GLOVE) ×1 IMPLANT
GLOVE SRG 8 PF TXTR STRL LF DI (GLOVE) ×1 IMPLANT
GOWN STRL REUS W/ TWL LRG LVL3 (GOWN DISPOSABLE) ×1 IMPLANT
GOWN STRL REUS W/ TWL XL LVL3 (GOWN DISPOSABLE) ×1 IMPLANT
GOWN TOGA ZIPPER T7+ PEEL AWAY (MISCELLANEOUS) ×1 IMPLANT
HOLDER FOLEY CATH W/STRAP (MISCELLANEOUS) ×1 IMPLANT
HOOD PEEL AWAY T7 (MISCELLANEOUS) ×1 IMPLANT
KIT TURNOVER KIT A (KITS) ×1 IMPLANT
KNIFE SCULPS 14X20 (INSTRUMENTS) ×1 IMPLANT
MANIFOLD NEPTUNE II (INSTRUMENTS) ×2 IMPLANT
NDL SPNL 20GX3.5 QUINCKE YW (NEEDLE) ×2 IMPLANT
NEEDLE SPNL 20GX3.5 QUINCKE YW (NEEDLE) ×2 IMPLANT
PACK TOTAL KNEE (MISCELLANEOUS) ×1 IMPLANT
PAD ABD DERMACEA PRESS 5X9 (GAUZE/BANDAGES/DRESSINGS) ×2 IMPLANT
PAD ARMBOARD POSITIONER FOAM (MISCELLANEOUS) ×3 IMPLANT
PAD COLD UNI WRAP-ON (PAD) ×1 IMPLANT
PENCIL SMOKE EVACUATOR COATED (MISCELLANEOUS) ×1 IMPLANT
PIN DRILL FIX HALF THREAD (BIT) ×2 IMPLANT
PIN FIXATION 1/8DIA X 3INL (PIN) ×1 IMPLANT
SOL .9 NS 3000ML IRR UROMATIC (IV SOLUTION) ×1 IMPLANT
SOLN STERILE WATER 1000 ML (IV SOLUTION) ×1 IMPLANT
SOLN STERILE WATER BTL 1000 ML (IV SOLUTION) ×1 IMPLANT
SOLUTION IRRIG SURGIPHOR (IV SOLUTION) ×1 IMPLANT
SPONGE DRAIN TRACH 4X4 STRL 2S (GAUZE/BANDAGES/DRESSINGS) ×1 IMPLANT
STAPLER SKIN PROX 35W (STAPLE) ×1 IMPLANT
STOCKINETTE IMPERV 14X48 (MISCELLANEOUS) ×1 IMPLANT
STOCKINETTE STRL BIAS CUT 8X4 (MISCELLANEOUS) ×1 IMPLANT
STRAP TIBIA SHORT (MISCELLANEOUS) ×1 IMPLANT
SUCTION TUBE FRAZIER 10FR DISP (SUCTIONS) ×1 IMPLANT
SUT VIC AB 0 CT1 36 (SUTURE) ×1 IMPLANT
SUT VIC AB 1 CT1 36 (SUTURE) ×2 IMPLANT
SUT VIC AB 2-0 CT2 27 (SUTURE) ×1 IMPLANT
SYR 30ML LL (SYRINGE) ×2 IMPLANT
TIP FAN IRRIG PULSAVAC PLUS (DISPOSABLE) ×1 IMPLANT
TOWEL OR 17X26 4PK STRL BLUE (TOWEL DISPOSABLE) IMPLANT
TOWER CARTRIDGE SMART MIX (DISPOSABLE) ×1 IMPLANT
TRAP FLUID SMOKE EVACUATOR (MISCELLANEOUS) ×1 IMPLANT
TRAY FOLEY MTR SLVR 16FR STAT (SET/KITS/TRAYS/PACK) ×1 IMPLANT

## 2024-02-06 NOTE — Anesthesia Preprocedure Evaluation (Addendum)
 Anesthesia Evaluation  Patient identified by MRN, date of birth, ID band Patient awake    Reviewed: Allergy & Precautions, H&P , NPO status , Patient's Chart, lab work & pertinent test results  Airway Mallampati: II  TM Distance: >3 FB Neck ROM: full    Dental no notable dental hx.    Pulmonary neg pulmonary ROS   Pulmonary exam normal        Cardiovascular negative cardio ROS Normal cardiovascular exam     Neuro/Psych Cervical spondylosis without myelopathy  negative psych ROS   GI/Hepatic Neg liver ROS,GERD  ,,  Endo/Other  negative endocrine ROS    Renal/GU      Musculoskeletal   Abdominal Normal abdominal exam  (+)   Peds  Hematology negative hematology ROS (+)   Anesthesia Other Findings Vocal cord polyp  Past Medical History: No date: Arthritis No date: Cancer (HCC)     Comment:  BREAST No date: Complication of anesthesia     Comment:  NO RECENT N/V No date: Family history of adverse reaction to anesthesia     Comment:  dad-n/v No date: Finger mass, right     Comment:  thumb No date: GERD (gastroesophageal reflux disease) 1980: Hepatitis     Comment:  unsure if A or B No date: PONV (postoperative nausea and vomiting)     Comment:  H/O  NONE WITH RECENT SURGIES  Past Surgical History: No date: ABDOMINAL HYSTERECTOMY No date: BREAST SURGERY; Right     Comment:  BX/ LUMPECTOMY with sentinel lymph biopsy 2001 No date: CARPAL TUNNEL RELEASE; Bilateral 10/26/2016: CATARACT EXTRACTION W/PHACO; Left     Comment:  Procedure: CATARACT EXTRACTION PHACO AND INTRAOCULAR               LENS PLACEMENT (IOC);  Surgeon: Jaye Fallow, MD;                Location: [facility] ORS;  Service: Ophthalmology;  Laterality:              Left;  US  00:43 AP% 12.0 CDE 5.20 Fluid pack lot #               [id number] 11/16/2016: CATARACT EXTRACTION W/PHACO; Right     Comment:  Procedure: CATARACT EXTRACTION PHACO AND  INTRAOCULAR               LENS PLACEMENT (IOC);  Surgeon: Jaye Fallow, MD;                Location: [facility] ORS;  Service: Ophthalmology;  Laterality:              Right;  US  00:45.9 AP% 13.4 CDE 6.16 Fluid Pack lot #               [id number] No date: COLONOSCOPY No date: DILATION AND CURETTAGE OF UTERUS No date: Needle Core Biopsy No date: TONSILLECTOMY     Comment:  age 73 No date: TUBAL LIGATION     Reproductive/Obstetrics negative OB ROS                              Anesthesia Physical Anesthesia Plan  ASA: 2  Anesthesia Plan: Spinal   Post-op Pain Management: Regional block*, Ofirmev  IV (intra-op)* and Toradol IV (intra-op)*   Induction: Intravenous  PONV Risk Score and Plan: Propofol infusion  Airway Management Planned: Natural Airway  Additional Equipment:   Intra-op Plan:   Post-operative Plan:  Informed Consent: I have reviewed the patients History and Physical, chart, labs and discussed the procedure including the risks, benefits and alternatives for the proposed anesthesia with the patient or authorized representative who has indicated his/her understanding and acceptance.     Dental Advisory Given  Plan Discussed with: CRNA and Surgeon  Anesthesia Plan Comments:          Anesthesia Quick Evaluation

## 2024-02-06 NOTE — Anesthesia Procedure Notes (Deleted)
 Spinal  Staffing Performed by: Vicci Camellia Glatter, MD Authorized by: Vicci Camellia Glatter, MD

## 2024-02-06 NOTE — Progress Notes (Incomplete)
 Subjective: 1 Day Post-Op Procedure(s) (LRB): ARTHROPLASTY, KNEE, TOTAL, USING IMAGELESS COMPUTER-ASSISTED NAVIGATION (Right) Patient reports pain as mild.   Patient seen in rounds with Dr. Mardee. Patient is well, and has had no acute complaints or problems Denies any CP, SOB, vomiting, fevers or chills. Did admit to nausea post-operatively, has seemed to improve We will start therapy today.  Plan is to go Home after hospital stay.  Objective: Vital signs in last 24 hours: Temp:  [97 F (36.1 C)-98 F (36.7 C)] 97.9 F (36.6 C) (10/14 0806) Pulse Rate:  [69-98] 69 (10/14 0806) Resp:  [9-18] 17 (10/14 0806) BP: (91-122)/(50-72) 117/61 (10/14 0806) SpO2:  [92 %-100 %] 97 % (10/14 0806)  Intake/Output from previous day:  Intake/Output Summary (Last 24 hours) at 02/07/2024 0843 Last data filed at 02/07/2024 0344 Gross per 24 hour  Intake 1428.89 ml  Output 910 ml  Net 518.89 ml    Intake/Output this shift: No intake/output data recorded.  Labs: No results for input(s): HGB in the last 72 hours. No results for input(s): WBC, RBC, HCT, PLT in the last 72 hours. No results for input(s): NA, K, CL, CO2, BUN, CREATININE, GLUCOSE, CALCIUM in the last 72 hours. No results for input(s): LABPT, INR in the last 72 hours.  EXAM General - Patient is Alert, Appropriate, and Oriented Extremity - Neurologically intact Neurovascular intact Sensation intact distally Intact pulses distally Dorsiflexion/Plantar flexion intact No cellulitis present Compartment soft Dressing - dressing C/D/I and no drainage Motor Function - intact, moving foot and toes well on exam. JP Drain pulled without difficulty. Intact  Past Medical History:  Diagnosis Date   Arthritis    Cancer (HCC)    BREAST   Complication of anesthesia    NO RECENT N/V   Family history of adverse reaction to anesthesia    dad-n/v   Finger mass, right    thumb   GERD (gastroesophageal  reflux disease)    Hepatitis 1980   unsure if A or B   PONV (postoperative nausea and vomiting)    H/O  NONE WITH RECENT SURGIES    Assessment/Plan: 1 Day Post-Op Procedure(s) (LRB): ARTHROPLASTY, KNEE, TOTAL, USING IMAGELESS COMPUTER-ASSISTED NAVIGATION (Right) Principal Problem:   History of total knee arthroplasty, right  Estimated body mass index is 24.6 kg/m as calculated from the following:   Height as of 01/25/24: 5' 2 (1.575 m).   Weight as of 01/25/24: 61 kg. Advance diet Up with therapy  Patient will continue to work with physical therapy to pass postoperative PT protocols, ROM and strengthening  Discussed with the patient continuing to utilize Polar Care  Patient will use bone foam in 20-30 minute intervals  Patient will wear TED hose bilaterally to help prevent DVT and clot formation  Discussed the Aquacel bandage.  This bandage will stay in place 7 days postoperatively.  Can be replaced with honeycomb bandages that will be sent home with the patient  Discussed sending the patient home with tramadol and oxycodone  for as needed pain management.  Patient will also be sent home with Celebrex to help with swelling and inflammation.  Patient will take an 81 mg aspirin twice daily for DVT prophylaxis  Patient will continue at home sublingual zofran  as needed  JP drain removed without difficulty, intact  Weight-Bearing as tolerated to right leg  Patient will follow-up with [facility] orthopedics in 2 weeks for staple removal and reevaluation  Fonda Koyanagi, PA-C [facility] 02/07/2024, 8:43 AM

## 2024-02-06 NOTE — Anesthesia Procedure Notes (Addendum)
 Spinal  Patient location during procedure: OR Start time: 02/06/2024 7:29 AM End time: 02/06/2024 7:36 AM Reason for block: surgical anesthesia Staffing Performed: anesthesiologist and resident/CRNA  Anesthesiologist: Vicci Camellia Glatter, MD Resident/CRNA: Myra Lawless, CRNA Performed by: Vicci Camellia Glatter, MD Authorized by: Vicci Camellia Glatter, MD   Preanesthetic Checklist Completed: patient identified, IV checked, site marked, risks and benefits discussed, surgical consent, monitors and equipment checked, pre-op evaluation and timeout performed Spinal Block Patient position: sitting Prep: DuraPrep Patient monitoring: heart rate, cardiac monitor, continuous pulse ox and blood pressure Approach: midline Location: L2-3 Injection technique: single-shot Needle Needle type: Pencan  Needle gauge: 24 G Needle length: 9 cm Assessment Sensory level: T10 Events: CSF return and second provider Additional Notes First attempt by CRNA. No Paresthesias noted. Second attempt by MD. Pt is no distress

## 2024-02-06 NOTE — Transfer of Care (Signed)
 Immediate Anesthesia Transfer of Care Note  Patient: Savannah Griffin  Procedure(s) Performed: ARTHROPLASTY, KNEE, TOTAL, USING IMAGELESS COMPUTER-ASSISTED NAVIGATION (Right: Knee)  Patient Location: PACU  Anesthesia Type:Spinal  Level of Consciousness: drowsy  Airway & Oxygen Therapy: Patient Spontanous Breathing and Patient connected to face mask oxygen  Post-op Assessment: Report given to RN  Post vital signs: stable  Last Vitals:  Vitals Value Taken Time  BP 115/55 02/06/24 10:45  Temp    Pulse 104 02/06/24 10:48  Resp 17 02/06/24 10:48  SpO2 100 % 02/06/24 10:48  Vitals shown include unfiled device data.  Last Pain:  Vitals:   02/06/24 0631  TempSrc: Temporal  PainSc: 0-No pain         Complications: No notable events documented.

## 2024-02-06 NOTE — Interval H&P Note (Signed)
 History and Physical Interval Note:  02/06/2024 6:06 AM  Savannah Griffin  has presented today for surgery, with the diagnosis of Primary osteoarthritis of right knee.  The various methods of treatment have been discussed with the patient and family. After consideration of risks, benefits and other options for treatment, the patient has consented to  Procedure(s): ARTHROPLASTY, KNEE, TOTAL, USING IMAGELESS COMPUTER-ASSISTED NAVIGATION (Right) as a surgical intervention.  The patient's history has been reviewed, patient examined, no change in status, stable for surgery.  I have reviewed the patient's chart and labs.  Questions were answered to the patient's satisfaction.     Leland Raver P Eunie Lawn

## 2024-02-06 NOTE — Discharge Summary (Signed)
 Physician Discharge Summary  Subjective: 1 Day Post-Op Procedure(s) (LRB): ARTHROPLASTY, KNEE, TOTAL, USING IMAGELESS COMPUTER-ASSISTED NAVIGATION (Right) Patient reports pain as mild.   Patient seen in rounds with Dr. Mardee. Patient is well, and has had no acute complaints or problems Denies any CP, SOB, vomiting, fevers or chills. Did admit to nausea post-operatively We will start therapy today.  Patient is ready to go home  Physician Discharge Summary  Patient ID: Savannah Griffin MRN: [id number] DOB/AGE: Mar 19, 1951 73 y.o.  Admit date: 02/06/2024 Discharge date: 02/07/2024  Admission Diagnoses:  Discharge Diagnoses:  Principal Problem:   History of total knee arthroplasty, right   Discharged Condition: good  Hospital Course: Patient presented to the hospital on 02/06/2024 for an elective right total knee arthroplasty performed by Dr. Mardee. Patient was given 1g of TXA and 2g of Ancef prior to the procedure. she tolerated the procedure well without any complications. See procedural note below for details. Postoperatively, the patient did very well. she was able to pass PT protocols on post-op day one without any issues. JP drain was removed without any difficulty and was intact. she was able to void her bladder without any difficulty. Physical exam was unremarkable. she denies any SOB, CP, Vomitting, fevers or chills. Did admit to postoperative nausea. Vital signs are stable. Patient is stable to discharge home.  PROCEDURE:  Right total knee arthroplasty using computer-assisted navigation   SURGEON:  Lynwood SHAUNNA Mardee Mickey. [name]   ASSISTANT:  Sidra Koyanagi, PA-C (present and scrubbed throughout the case, critical for assistance with exposure, retraction, instrumentation, and closure)   ANESTHESIA: spinal   ESTIMATED BLOOD LOSS: 50 mL   FLUIDS REPLACED: 700 mL of crystalloid   TOURNIQUET TIME: 78 minutes   DRAINS: 2 medium Hemovac drains   SOFT TISSUE RELEASES: Anterior  cruciate ligament, posterior cruciate ligament, deep medial collateral ligament, patellofemoral ligament   IMPLANTS UTILIZED: DePuy Attune size 5N posterior stabilized femoral component (cemented), size 3 rotating platform tibial component (cemented), 32 mm medialized dome patella (cemented), and a 5 mm stabilized rotating platform polyethylene insert.  Treatments: Zofran  for as needed nausea, will continue with at home sublingual medication  Discharge Exam: Blood pressure 117/61, pulse 69, temperature 97.9 F (36.6 C), temperature source Oral, resp. rate 17, SpO2 97%.   Disposition: home   Allergies as of 02/07/2024       Reactions   Augmentin [amoxicillin-pot Clavulanate] Other (See Comments)   Headache and GI upset   Avelox  [moxifloxacin ] Other (See Comments)   Headache and GI upset   Biaxin [clarithromycin] Other (See Comments)   Gi upset   Cefdinir Other (See Comments)   Gi-Upset   Clindamycin/lincomycin Other (See Comments)   GI upset   Doxycycline Other (See Comments)    GI upset   Gluten Meal Other (See Comments)   Headache and   Lactose Intolerance (gi) Other (See Comments)   Gi-upset, any kind of dairy   Macrodantin [nitrofurantoin Macrocrystal] Other (See Comments)   Headache    Septra [sulfamethoxazole-trimethoprim] Other (See Comments)   Headache   Escitalopram Other (See Comments)   And shakiness        Medication List     STOP taking these medications    Advil 200 MG tablet Generic drug: ibuprofen       TAKE these medications    acetaminophen  500 MG tablet Commonly known as: TYLENOL  Take 1,000 mg by mouth daily.   aspirin 81 MG chewable tablet Chew 1 tablet (81 mg total)  by mouth 2 (two) times daily.   celecoxib 200 MG capsule Commonly known as: CELEBREX Take 1 capsule (200 mg total) by mouth 2 (two) times daily.   cyanocobalamin 1000 MCG/ML injection Commonly known as: VITAMIN B12 Inject 1,000 mcg into the muscle every 30 (thirty)  days.   diclofenac Sodium 1 % Gel Commonly known as: VOLTAREN Apply 2 g topically daily as needed (Knee pain).   LORazepam  0.5 MG tablet Commonly known as: ATIVAN  Take 0.5 mg by mouth every 8 (eight) hours as needed for anxiety.   ondansetron  4 MG disintegrating tablet Commonly known as: ZOFRAN -ODT Take 1 tablet (4 mg total) by mouth every 8 (eight) hours as needed for nausea or vomiting.   oxyCODONE  5 MG immediate release tablet Commonly known as: Oxy IR/ROXICODONE  Take 1 tablet (5 mg total) by mouth every 4 (four) hours as needed for moderate pain (pain score 4-6) (pain score 4-6).   oxyCODONE -acetaminophen  5-325 MG tablet Commonly known as: PERCOCET/ROXICET Take 1 tablet by mouth every 4 (four) hours as needed for severe pain (pain score 7-10). What changed: reasons to take this   traMADol 50 MG tablet Commonly known as: ULTRAM Take 1-2 tablets (50-100 mg total) by mouth every 4 (four) hours as needed for moderate pain (pain score 4-6).               Durable Medical Equipment  (From admission, onward)           Start     Ordered   02/06/24 1219  DME Walker rolling  Once       Question:  Patient needs a walker to treat with the following condition  Answer:  Total knee replacement status   02/06/24 1218   02/06/24 1219  DME Bedside commode  Once       Comments: Patient is not able to walk the distance required to go the bathroom, or he/she is unable to safely negotiate stairs required to access the bathroom.  A 3in1 BSC will alleviate this problem  Question:  Patient needs a bedside commode to treat with the following condition  Answer:  Total knee replacement status   02/06/24 1218            Follow-up Information     Drake Chew, PA-C Follow up on 02/21/2024.   Specialty: Orthopedic Surgery Why: at 1:15pm Contact information: [address] [place] [address] [phone]         Mardee Lynwood SQUIBB, MD Follow up on 03/20/2024.    Specialty: Orthopedic Surgery Why: at 2:45pm Contact information: [address] [facility] [place] [address] [phone]                 Signed: Sidra Drake 02/07/2024, 8:43 AM   Objective: Vital signs in last 24 hours: Temp:  [97 F (36.1 C)-98 F (36.7 C)] 97.9 F (36.6 C) (10/14 0806) Pulse Rate:  [69-98] 69 (10/14 0806) Resp:  [9-18] 17 (10/14 0806) BP: (91-122)/(50-72) 117/61 (10/14 0806) SpO2:  [92 %-100 %] 97 % (10/14 0806)  Intake/Output from previous day:  Intake/Output Summary (Last 24 hours) at 02/07/2024 0843 Last data filed at 02/07/2024 0344 Gross per 24 hour  Intake 1428.89 ml  Output 910 ml  Net 518.89 ml    Intake/Output this shift: No intake/output data recorded.  Labs: No results for input(s): HGB in the last 72 hours. No results for input(s): WBC, RBC, HCT, PLT in the last 72 hours. No results for  input(s): NA, K, CL, CO2, BUN, CREATININE, GLUCOSE, CALCIUM in the last 72 hours. No results for input(s): LABPT, INR in the last 72 hours.  EXAM: General - Patient is Alert, Appropriate, and Oriented Extremity - Neurologically intact Neurovascular intact Sensation intact distally Intact pulses distally Dorsiflexion/Plantar flexion intact No cellulitis present Compartment soft Dressing - dressing C/D/I and no drainage Motor Function - intact, moving foot and toes well on exam. JP Drain pulled without difficulty. Intact  Assessment/Plan: 1 Day Post-Op Procedure(s) (LRB): ARTHROPLASTY, KNEE, TOTAL, USING IMAGELESS COMPUTER-ASSISTED NAVIGATION (Right) Procedure(s) (LRB): ARTHROPLASTY, KNEE, TOTAL, USING IMAGELESS COMPUTER-ASSISTED NAVIGATION (Right) Past Medical History:  Diagnosis Date   Arthritis    Cancer (HCC)    BREAST   Complication of anesthesia    NO RECENT N/V   Family history of adverse reaction to anesthesia    dad-n/v   Finger mass, right    thumb   GERD  (gastroesophageal reflux disease)    Hepatitis 1980   unsure if A or B   PONV (postoperative nausea and vomiting)    H/O  NONE WITH RECENT SURGIES   Principal Problem:   History of total knee arthroplasty, right  Estimated body mass index is 24.6 kg/m as calculated from the following:   Height as of 01/25/24: 5' 2 (1.575 m).   Weight as of 01/25/24: 61 kg.   Patient will continue to work with physical therapy to pass postoperative PT protocols, ROM and strengthening   Discussed with the patient continuing to utilize Polar Care   Patient will use bone foam in 20-30 minute intervals   Patient will wear TED hose bilaterally to help prevent DVT and clot formation   Discussed the Aquacel bandage.  This bandage will stay in place 7 days postoperatively.  Can be replaced with honeycomb bandages that will be sent home with the patient   Discussed sending the patient home with tramadol and oxycodone  for as needed pain management.  Patient will also be sent home with Celebrex to help with swelling and inflammation.  Patient will take an 81 mg aspirin twice daily for DVT prophylaxis  Patient did experience post-operative nausea and will continue at home PRN zofran    JP drain removed without difficulty, intact   Weight-Bearing as tolerated to right leg   Patient will follow-up with [facility] orthopedics in 2 weeks for staple removal and reevaluation  Diet - Regular diet Follow up - in 2 weeks Activity - WBAT Disposition - Home Condition Upon Discharge - Good DVT Prophylaxis - Aspirin and TED hose  Fonda CHARLENA Koyanagi, PA-C Orthopaedic Surgery 02/07/2024, 8:43 AM

## 2024-02-06 NOTE — Op Note (Signed)
 OPERATIVE NOTE  DATE OF SURGERY:  02/06/2024  PATIENT NAME:  Savannah Griffin   DOB: [birth year]  MRN: [id number]  PRE-OPERATIVE DIAGNOSIS: Degenerative arthrosis of the right knee, primary  POST-OPERATIVE DIAGNOSIS:  Same  PROCEDURE:  Right total knee arthroplasty using computer-assisted navigation  SURGEON:  Lynwood SHAUNNA Mardee Mickey. [name]  ASSISTANT:  Sidra Koyanagi, PA-C (present and scrubbed throughout the case, critical for assistance with exposure, retraction, instrumentation, and closure)  ANESTHESIA: spinal  ESTIMATED BLOOD LOSS: 50 mL  FLUIDS REPLACED: 700 mL of crystalloid  TOURNIQUET TIME: 78 minutes  DRAINS: 2 medium Hemovac drains  SOFT TISSUE RELEASES: Anterior cruciate ligament, posterior cruciate ligament, deep medial collateral ligament, patellofemoral ligament  IMPLANTS UTILIZED: DePuy Attune size 5N posterior stabilized femoral component (cemented), size 3 rotating platform tibial component (cemented), 32 mm medialized dome patella (cemented), and a 5 mm stabilized rotating platform polyethylene insert.  INDICATIONS FOR SURGERY: Savannah Griffin is a 73 y.o. year old female with a long history of progressive knee pain. X-rays demonstrated severe degenerative changes in tricompartmental fashion. The patient had not seen any significant improvement despite conservative nonsurgical intervention. After discussion of the risks and benefits of surgical intervention, the patient expressed understanding of the risks benefits and agree with plans for total knee arthroplasty.   The risks, benefits, and alternatives were discussed at length including but not limited to the risks of infection, bleeding, nerve injury, stiffness, blood clots, the need for revision surgery, cardiopulmonary complications, among others, and they were willing to proceed.  PROCEDURE IN DETAIL: The patient was brought into the operating room and, after adequate spinal anesthesia was achieved, a tourniquet was  placed on the patient's upper thigh. The patient's knee and leg were cleaned and prepped with alcohol and DuraPrep and draped in the usual sterile fashion. A timeout was performed as per usual protocol. The lower extremity was exsanguinated using an Esmarch, and the tourniquet was inflated to 300 mmHg. An anterior longitudinal incision was made followed by a standard mid vastus approach. The deep fibers of the medial collateral ligament were elevated in a subperiosteal fashion off of the medial flare of the tibia so as to maintain a continuous soft tissue sleeve. The patella was subluxed laterally and the patellofemoral ligament was incised. Inspection of the knee demonstrated severe degenerative changes with full-thickness loss of articular cartilage. Osteophytes were debrided using a rongeur. Anterior and posterior cruciate ligaments were excised. Two 4.0 mm Schanz pins were inserted in the femur and into the tibia for attachment of the array of trackers used for computer-assisted navigation. Hip center was identified using a circumduction technique. Distal landmarks were mapped using the computer. The distal femur and proximal tibia were mapped using the computer. The distal femoral cutting guide was positioned using computer-assisted navigation so as to achieve a 5 distal valgus cut. The femur was sized and it was felt that a size 5N femoral component was appropriate. A size 5 femoral cutting guide was positioned and the anterior cut was performed and verified using the computer. This was followed by completion of the posterior and chamfer cuts. Femoral cutting guide for the central box was then positioned in the center box cut was performed.  Attention was then directed to the proximal tibia. Medial and lateral menisci were excised. The extramedullary tibial cutting guide was positioned using computer-assisted navigation so as to achieve a 0 varus-valgus alignment and 3 posterior slope. The cut was  performed and verified using the computer. The proximal tibia  was sized and it was felt that a size 3 tibial tray was appropriate. Tibial and femoral trials were inserted followed by insertion of a 5 mm polyethylene insert. This allowed for excellent mediolateral soft tissue balancing both in flexion and in full extension. Finally, the patella was cut and prepared so as to accommodate a 32 mm medialized dome patella. A patella trial was placed and the knee was placed through a range of motion with excellent patellar tracking appreciated. The femoral trial was removed after debridement of posterior osteophytes. The central post-hole for the tibial component was reamed followed by insertion of a keel punch. Tibial trials were then removed. Cut surfaces of bone were irrigated with copious amounts of normal saline using pulsatile lavage and then suctioned dry. Polymethylmethacrylate cement was prepared in the usual fashion using a vacuum mixer. Cement was applied to the cut surface of the proximal tibia as well as along the undersurface of a size 3 rotating platform tibial component. Tibial component was positioned and impacted into place. Excess cement was removed using Personal assistant. Cement was then applied to the cut surfaces of the femur as well as along the posterior flanges of the size 5N femoral component. The femoral component was positioned and impacted into place. Excess cement was removed using Personal assistant. A 5 mm polyethylene trial was inserted and the knee was brought into full extension with steady axial compression applied. Finally, cement was applied to the backside of a 32 mm medialized dome patella and the patellar component was positioned and patellar clamp applied. Excess cement was removed using Personal assistant. After adequate curing of the cement, the tourniquet was deflated after a total tourniquet time of 78 minutes. Hemostasis was achieved using electrocautery. The knee was irrigated with  copious amounts of normal saline using pulsatile lavage followed by 450 ml of Surgiphor and then suctioned dry. 20 mL of 1.3% Exparel and 60 mL of 0.25% Marcaine in 40 mL of normal saline was injected along the posterior capsule, medial and lateral gutters, and along the arthrotomy site. A 5 mm stabilized rotating platform polyethylene insert was inserted and the knee was placed through a range of motion with excellent mediolateral soft tissue balancing appreciated and excellent patellar tracking noted. 2 medium drains were placed in the wound bed and brought out through separate stab incisions. The medial parapatellar portion of the incision was reapproximated using interrupted sutures of #1 Vicryl. Subcutaneous tissue was approximated in layers using first #0 Vicryl followed #2-0 Vicryl. The skin was approximated with skin staples. A sterile dressing was applied.  The patient tolerated the procedure well and was transported to the recovery room in stable condition.    Phares Zaccone P. Tyheem Boughner, [name], [name]

## 2024-02-06 NOTE — Progress Notes (Signed)
 Patient is not able to walk the distance required to go the bathroom, or he/she is unable to safely negotiate stairs required to access the bathroom.  A 3in1 BSC will alleviate this problem   Amenda Duclos P. Angie Fava [name]

## 2024-02-06 NOTE — Evaluation (Signed)
 Physical Therapy Evaluation Patient Details Name: Savannah Griffin MRN: [id number] DOB: August 02, 1950 Today's Date: 02/06/2024  History of Present Illness  Savannah Griffin is a 73 y.o. female who has severe Right knee pain. Patient reports a longstanding history of right knee discomfort which has progressively gotten worse over the last 5 to 6 months. Pt today PO day 0 TKA on R knee.  Clinical Impression  Patient seen for initial PT evaluation due to decline in functional status. Patient is A&O x 4. Baseline mobility reported as modI with occasional cane use, currently requiring minA for transfers and ambulation with RW; pt able to ambulate 10 feet in bed room. Gait assessed with RW, limited by nausea and vomiting. Pt lives alone and plans to return home with daughter who will be there to provide assistance when need. Pt has no STE home. Clinical impression: patient presents with moderare mobility limitations secondary to acute orthopedic surgery. Recommend skilled PT to address safety, mobility, and discharge planning.          If plan is discharge home, recommend the following: A little help with walking and/or transfers;A little help with bathing/dressing/bathroom   Can travel by private vehicle        Equipment Recommendations Standard walker;Cane;BSC/3in1  Recommendations for Other Services       Functional Status Assessment Patient has had a recent decline in their functional status and/or demonstrates limited ability to make significant improvements in function in a reasonable and predictable amount of time     Precautions / Restrictions Precautions Precautions: Fall Restrictions Weight Bearing Restrictions Per Provider Order: Yes      Mobility  Bed Mobility Overal bed mobility: Needs Assistance Bed Mobility: Rolling, Sidelying to Sit, Supine to Sit Rolling: Min assist Sidelying to sit: Min assist Supine to sit: Min assist          Transfers Overall transfer level:  Needs assistance Equipment used: Rolling walker (2 wheels) Transfers: Sit to/from Stand Sit to Stand: Min assist (avoid use of RLE)                Ambulation/Gait Ambulation/Gait assistance: Mod assist, Min assist Gait Distance (Feet): 10 Feet Assistive device: Rolling walker (2 wheels) Gait Pattern/deviations: Step-to pattern, Shuffle, Decreased stance time - right       General Gait Details: limited gait due to nausea; pt vomited after ambulation; pt reports feeling better afterward; several vc for symetrical loading  Stairs            Wheelchair Mobility     Tilt Bed    Modified Rankin (Stroke Patients Only)       Balance Overall balance assessment: Needs assistance Sitting-balance support: Single extremity supported Sitting balance-Leahy Scale: Good     Standing balance support: During functional activity, Reliant on assistive device for balance Standing balance-Leahy Scale: Fair Standing balance comment: significant weight shift away from R leg; vc for L weight shift                             Pertinent Vitals/Pain Pain Assessment Pain Assessment: 0-10 Pain Score: 7  Pain Location: R knee pain Pain Descriptors / Indicators: Aching, Dull, Tightness Pain Intervention(s): Monitored during session, Premedicated before session, Limited activity within patient's tolerance    Home Living Family/patient expects to be discharged to:: Private residence Living Arrangements: Alone Available Help at Discharge: Family Type of Home: House  Home Layout: One level Home Equipment: None;Shower seat - built in;Cane - quad;Crutches      Prior Function Prior Level of Function : Independent/Modified Independent             Mobility Comments: independent baseline; requiring a cane occassional       Extremity/Trunk Assessment        Lower Extremity Assessment Lower Extremity Assessment: Generalized weakness    Cervical / Trunk  Assessment Cervical / Trunk Assessment: Normal  Communication        Cognition                                         Cueing       General Comments      Exercises Total Joint Exercises Ankle Circles/Pumps: AROM, Right, Left, 10 reps Quad Sets: AROM, Right, Left, 10 reps Short Arc Quad: AROM, Right, 10 reps Heel Slides: AROM, Right, 10 reps Straight Leg Raises: AROM, Right, 5 reps Long Arc Quad: AROM, Right, 10 reps Knee Flexion: AROM, Right, 10 reps Goniometric ROM: 45 degrees; limited by surgical dressing Other Exercises Other Exercises: pt educated on PO requirements and expectations for PT post surgery; Pt edcuated on the importance of continued movement and completion of HEP   Assessment/Plan    PT Assessment Patient needs continued PT services  PT Problem List Decreased strength;Decreased range of motion;Decreased activity tolerance;Decreased balance;Decreased mobility;Pain       PT Treatment Interventions DME instruction;Gait training;Stair training;Functional mobility training;Therapeutic activities;Therapeutic exercise;Balance training;Neuromuscular re-education;Patient/family education;Manual techniques    PT Goals (Current goals can be found in the Care Plan section)  Acute Rehab PT Goals Patient Stated Goal: Pt wants to go home PT Goal Formulation: With patient/family Time For Goal Achievement: 02/20/24 Potential to Achieve Goals: Good    Frequency BID     Co-evaluation               AM-PAC PT 6 Clicks Mobility  Outcome Measure Help needed turning from your back to your side while in a flat bed without using bedrails?: None Help needed moving from lying on your back to sitting on the side of a flat bed without using bedrails?: A Little Help needed moving to and from a bed to a chair (including a wheelchair)?: A Little Help needed standing up from a chair using your arms (e.g., wheelchair or bedside chair)?: A Little Help  needed to walk in hospital room?: A Lot Help needed climbing 3-5 steps with a railing? : Total 6 Click Score: 16    End of Session Equipment Utilized During Treatment: Gait belt Activity Tolerance: Patient limited by pain (limited by nausea) Patient left: in bed;with bed alarm set Nurse Communication: Mobility status PT Visit Diagnosis: Other abnormalities of gait and mobility (R26.89);Muscle weakness (generalized) (M62.81);Difficulty in walking, not elsewhere classified (R26.2)    Time: 8657-8587 PT Time Calculation (min) (ACUTE ONLY): 30 min   Charges:   PT Evaluation $PT Eval Low Complexity: 1 Low   PT General Charges $$ ACUTE PT VISIT: 1 Visit         Sherlean Lesches DPT, PT    Calab Sachse A Yordin Rhoda 02/06/2024, 2:29 PM

## 2024-02-06 NOTE — Plan of Care (Signed)
  Problem: Skin Integrity: Goal: Will show signs of wound healing Outcome: Progressing   

## 2024-02-07 ENCOUNTER — Encounter: Payer: Self-pay | Admitting: Orthopedic Surgery

## 2024-02-07 ENCOUNTER — Other Ambulatory Visit: Payer: Self-pay

## 2024-02-07 DIAGNOSIS — M1711 Unilateral primary osteoarthritis, right knee: Secondary | ICD-10-CM | POA: Diagnosis not present

## 2024-02-07 MED ORDER — ASPIRIN 81 MG PO CHEW: 81.0000 mg | CHEWABLE_TABLET | Freq: Two times a day (BID) | ORAL | Status: AC

## 2024-02-07 MED ORDER — OXYCODONE HCL 5 MG PO TABS: 5.0000 mg | ORAL_TABLET | ORAL | 0 refills | Status: DC | PRN

## 2024-02-07 MED ORDER — TRAMADOL HCL 50 MG PO TABS: 50.0000 mg | ORAL_TABLET | ORAL | 0 refills | Status: DC | PRN

## 2024-02-07 MED ORDER — TRAMADOL HCL 50 MG PO TABS
50.0000 mg | ORAL_TABLET | ORAL | 0 refills | Status: DC | PRN
  Filled 2024-02-07: qty 30, 4d supply, fill #0

## 2024-02-07 MED ORDER — CELECOXIB 200 MG PO CAPS: 200.0000 mg | ORAL_CAPSULE | Freq: Two times a day (BID) | ORAL | 1 refills | Status: DC

## 2024-02-07 MED ORDER — CELECOXIB 200 MG PO CAPS
200.0000 mg | ORAL_CAPSULE | Freq: Two times a day (BID) | ORAL | 1 refills | Status: AC
  Filled 2024-02-07: qty 60, 30d supply, fill #0

## 2024-02-07 MED ORDER — ASPIRIN 81 MG PO CHEW: 81.0000 mg | CHEWABLE_TABLET | Freq: Two times a day (BID) | ORAL | Status: DC

## 2024-02-07 MED ORDER — OXYCODONE HCL 5 MG PO TABS
5.0000 mg | ORAL_TABLET | ORAL | 0 refills | Status: AC | PRN
  Filled 2024-02-07: qty 30, 5d supply, fill #0

## 2024-02-07 MED ORDER — ASPIRIN 81 MG PO CHEW
81.0000 mg | CHEWABLE_TABLET | Freq: Two times a day (BID) | ORAL | Status: DC
  Administered 2024-02-07: 81 mg via ORAL
  Filled 2024-02-07: qty 1

## 2024-02-07 NOTE — Anesthesia Postprocedure Evaluation (Signed)
 Anesthesia Post Note  Patient: Savannah Griffin  Procedure(s) Performed: ARTHROPLASTY, KNEE, TOTAL, USING IMAGELESS COMPUTER-ASSISTED NAVIGATION (Right: Knee)  Patient location during evaluation: Nursing Unit Anesthesia Type: Spinal Level of consciousness: oriented and awake and alert Pain management: pain level controlled Vital Signs Assessment: post-procedure vital signs reviewed and stable Respiratory status: spontaneous breathing and respiratory function stable Cardiovascular status: blood pressure returned to baseline and stable Postop Assessment: no headache, no backache, no apparent nausea or vomiting and patient able to bend at knees Anesthetic complications: no   No notable events documented.   Last Vitals:  Vitals:   02/06/24 2326 02/07/24 0343  BP: (!) 122/50 (!) 95/56  Pulse: 86 89  Resp: 18 18  Temp:  (!) 36.1 C  SpO2: 98% 94%    Last Pain:  Vitals:   02/07/24 0703  TempSrc:   PainSc: Asleep                 Camelia KATHEE Hollingsworth

## 2024-02-07 NOTE — Evaluation (Signed)
 Occupational Therapy Evaluation Patient Details Name: Savannah Griffin MRN: [id number] DOB: 1950-07-20 Today's Date: 02/07/2024   History of Present Illness   Hyun Shantrell Placzek is a 73 y.o. female who has severe Right knee pain. Patient reports a longstanding history of right knee discomfort which has progressively gotten worse over the last 5 to 6 months. Pt today PO day 1 TKA on R knee.     Clinical Impressions Patient was seen for OT evaluation this date. Prior to hospital admission, patient was independent with all ADLs/IADLs/mobility. Patient lives alone in single story home however daughter will be staying with patient 24/7 indefinitely upon discharge to assist patient with all needs. Planned DC today, OT reviewed iceman usage, how to apply ted hose, ADL strategies and fall prevention measures to incorporate at home; patient able to perform dressing, toileting, grooming without any A; able to perform bed mobility/transfers with supervision but no physical A required. Patients pain increased to 7/10 at end of visit, RN notified and agreeable to provide pain medication. Patient returned to semi fowlers in bed, with call light in reach, bed alarm on and daughter present. No further needs while inpatient, OT will sign off.     If plan is discharge home, recommend the following:   A little help with walking and/or transfers     Functional Status Assessment   Patient has had a recent decline in their functional status and demonstrates the ability to make significant improvements in function in a reasonable and predictable amount of time.     Equipment Recommendations   BSC/3in1     Recommendations for Other Services         Precautions/Restrictions   Precautions Precautions: Fall Recall of Precautions/Restrictions: Intact Restrictions Weight Bearing Restrictions Per Provider Order: Yes RLE Weight Bearing Per Provider Order: Weight bearing as tolerated     Mobility  Bed Mobility Overal bed mobility: Needs Assistance Bed Mobility: Rolling, Sidelying to Sit, Supine to Sit Rolling: Supervision Sidelying to sit: Supervision Supine to sit: Contact guard          Transfers Overall transfer level: Needs assistance Equipment used: Rolling walker (2 wheels) Transfers: Sit to/from Stand Sit to Stand: Modified independent (Device/Increase time)           General transfer comment: patient able to perform toilet and bed transfer without any physical A      Balance Overall balance assessment: Modified Independent Sitting-balance support: No upper extremity supported, Feet supported Sitting balance-Leahy Scale: Normal     Standing balance support: During functional activity, Reliant on assistive device for balance Standing balance-Leahy Scale: Good                             ADL either performed or assessed with clinical judgement   ADL Overall ADL's : Modified independent                                       General ADL Comments: patient able to perform full dresing, toileting, grooming without A; all items in reach and patient required no AD with exception of rolling walker for steadying A     Vision         Perception         Praxis         Pertinent Vitals/Pain Pain Assessment Pain Assessment: 0-10 Pain Score:  6  Pain Location: R knee pain Pain Descriptors / Indicators: Aching, Dull, Tightness Pain Intervention(s): Monitored during session     Extremity/Trunk Assessment Upper Extremity Assessment Upper Extremity Assessment: Overall WFL for tasks assessed   Lower Extremity Assessment Lower Extremity Assessment: Defer to PT evaluation       Communication Communication Communication: No apparent difficulties   Cognition Arousal: Alert Behavior During Therapy: WFL for tasks assessed/performed Cognition: No apparent impairments                               Following  commands: Intact       Cueing  General Comments   Cueing Techniques: Verbal cues      Exercises     Shoulder Instructions      Home Living Family/patient expects to be discharged to:: Private residence Living Arrangements: Alone Available Help at Discharge: Family Type of Home: House       Home Layout: One level     Bathroom Shower/Tub: Producer, television/film/video: Handicapped height Bathroom Accessibility: Yes   Home Equipment: Information systems manager - built in;Cane - quad;Crutches          Prior Functioning/Environment Prior Level of Function : Independent/Modified Independent             Mobility Comments: independent baseline; requiring a cane occassional ADLs Comments: MOD I    OT Problem List: Decreased activity tolerance   OT Treatment/Interventions:        OT Goals(Current goals can be found in the care plan section)   Acute Rehab OT Goals Patient Stated Goal: to go home. OT Goal Formulation: With patient Time For Goal Achievement: 02/21/24 Potential to Achieve Goals: Good   OT Frequency:       Co-evaluation              AM-PAC OT 6 Clicks Daily Activity     Outcome Measure Help from another person eating meals?: None Help from another person taking care of personal grooming?: None Help from another person toileting, which includes using toliet, bedpan, or urinal?: None Help from another person bathing (including washing, rinsing, drying)?: A Little Help from another person to put on and taking off regular upper body clothing?: None Help from another person to put on and taking off regular lower body clothing?: None 6 Click Score: 23   End of Session Equipment Utilized During Treatment: Rolling walker (2 wheels) Nurse Communication: Patient requests pain meds  Activity Tolerance: Patient tolerated treatment well Patient left: in bed;with bed alarm set;with family/visitor present  OT Visit Diagnosis: Unsteadiness on feet  (R26.81)                Time: 9143-9086 OT Time Calculation (min): 17 min Charges:  OT General Charges $OT Visit: 1 Visit OT Evaluation $OT Eval Low Complexity: 1 Low OT Treatments $Self Care/Home Management : 8-22 mins  Rogers Clause, OT/L MSOT, 02/07/2024

## 2024-02-07 NOTE — Plan of Care (Signed)
   Problem: Activity: Goal: Ability to avoid complications of mobility impairment will improve Outcome: Progressing   Problem: Pain Management: Goal: Pain level will decrease with appropriate interventions Outcome: Progressing

## 2024-02-07 NOTE — Care Management Obs Status (Signed)
 MEDICARE OBSERVATION STATUS NOTIFICATION   Patient Details  Name: Savannah Griffin MRN: [id number] Date of Birth: July 16, 1950   Medicare Observation Status Notification Given:  Yes    Rojelio SHAUNNA Rattler 02/07/2024, 11:02 AM

## 2024-02-07 NOTE — TOC Transition Note (Signed)
 Transition of Care [facility]) - Discharge Note   Patient Details  Name: Savannah Griffin MRN: [id number] Date of Birth: 04/29/1950  Transition of Care [facility]) CM/SW Contact:  Marinda Cooks, RN Phone Number: 02/07/2024, 10:50 AM   Clinical Narrative:    This CM updated by covering MD pt medically cleared to dc today and has active DC order . HH pre-arranged with  Therapist, music . DME coordinated per MD order and scheduled to be delivered bedside. DC transportation confirmed for pt with family Medical team updated . No additional DC needs requested by medical team or identified by CM at this time .    Final next level of care: Home w Home Health Services Barriers to Discharge: No Barriers Identified   Patient Goals and CMS Choice Patient states their goals for this hospitalization and ongoing recovery are:: To return home with [facility] CMS Medicare.gov Compare Post Acute Care list provided to:: Patient Choice offered to / list presented to : Patient      Discharge Placement                  Name of family member notified: Patient and Family Patient and family notified of of transfer: 02/07/24  Discharge Plan and Services Additional resources added to the After Visit Summary for                  DME Arranged: 3-N-1, Walker rolling DME Agency: [employer] Date DME Agency Contacted: 02/07/24 Time DME Agency Contacted: 1048 Representative spoke with at DME Agency: [employer] HH Arranged: PT, OT HH Agency: [facility]     Representative spoke with at [facility] Agency: HH pre arranged with Shepard Gills  Social Drivers of Health (SDOH) Interventions SDOH Screenings   Food Insecurity: No Food Insecurity (02/06/2024)  Housing: Low Risk  (02/06/2024)  Transportation Needs: No Transportation Needs (02/06/2024)  Utilities: Not At Risk (02/06/2024)  Financial Resource Strain: Low Risk  (01/19/2024)   Received from [facility]  Social Connections: Moderately Isolated  (02/06/2024)  Tobacco Use: Low Risk  (02/06/2024)     Readmission Risk Interventions     No data to display

## 2024-02-07 NOTE — Progress Notes (Signed)
 Physical Therapy Treatment Patient Details Name: Savannah Griffin MRN: [id number] DOB: 08-27-1950 Today's Date: 02/07/2024   History of Present Illness Savannah Griffin is a 73 y.o. female who has severe Right knee pain. Patient reports a longstanding history of right knee discomfort which has progressively gotten worse over the last 5 to 6 months. Pt today PO day 1 TKA on R knee.    PT Comments  Patient seen for PT session focused on ambulation stair training and tolerance for OOB mobility. Patient required minA improved to supervision for transfers and used RW . Pt ambulated 200 feet with RW supervision required occasional vc to improve general lower extremity mechanics. Tolerated session well with no signs of exertion or distress. Interventions aimed at improving knee ROM able to bend at 90 degrees and ambulate greater distance compared to initial evaluation. Continued skilled PT recommended to progress toward functional goals and support discharge readiness.    If plan is discharge home, recommend the following: A little help with walking and/or transfers;A little help with bathing/dressing/bathroom   Can travel by private vehicle        Equipment Recommendations  Standard walker;Cane;BSC/3in1    Recommendations for Other Services       Precautions / Restrictions Precautions Precautions: Fall Restrictions Weight Bearing Restrictions Per Provider Order: Yes RLE Weight Bearing Per Provider Order: Weight bearing as tolerated     Mobility  Bed Mobility Overal bed mobility: Needs Assistance Bed Mobility: Rolling, Sidelying to Sit, Supine to Sit Rolling: Supervision Sidelying to sit: Supervision Supine to sit: Contact guard          Transfers Overall transfer level: Needs assistance Equipment used: Rolling walker (2 wheels) Transfers: Sit to/from Stand Sit to Stand: Min assist           General transfer comment: improved ability to load RLE     Ambulation/Gait Ambulation/Gait assistance: Contact guard assist, Supervision Gait Distance (Feet): 200 Feet Assistive device: Rolling walker (2 wheels) Gait Pattern/deviations: Step-to pattern, Shuffle, Decreased stance time - right       General Gait Details: pt reports feeling better overall and encouraged to keep walking; vc for symetrical loading improved since initail evaluation   Stairs Stairs: Yes Stairs assistance: Min assist Stair Management: One rail Left, Step to pattern Number of Stairs: 10 General stair comments: pt educated on movement sequence; knee flexion exercise preformed on last step x 10 reps   Wheelchair Mobility     Tilt Bed    Modified Rankin (Stroke Patients Only)       Balance Overall balance assessment: Modified Independent   Sitting balance-Leahy Scale: Normal     Standing balance support: During functional activity, Reliant on assistive device for balance Standing balance-Leahy Scale: Good                              Communication Communication Communication: No apparent difficulties  Cognition Arousal: Alert Behavior During Therapy: WFL for tasks assessed/performed   PT - Cognitive impairments: No apparent impairments                         Following commands: Intact      Cueing Cueing Techniques: Verbal cues  Exercises Total Joint Exercises Ankle Circles/Pumps: AROM, Right, Left, 10 reps Quad Sets: AROM, Right, Left, 10 reps Heel Slides: AROM, Right, 10 reps Long Arc Quad: AROM, Right, 10 reps Goniometric ROM: 90 degrees  General Comments        Pertinent Vitals/Pain Pain Assessment Pain Assessment: 0-10 Pain Score: 4  Pain Location: R knee pain Pain Descriptors / Indicators: Aching, Dull, Tightness Pain Intervention(s): Limited activity within patient's tolerance, Monitored during session    Home Living                          Prior Function            PT Goals  (current goals can now be found in the care plan section) Acute Rehab PT Goals Patient Stated Goal: Pt wants to go home PT Goal Formulation: With patient Time For Goal Achievement: 02/20/24 Potential to Achieve Goals: Good Progress towards PT goals: Progressing toward goals    Frequency    BID      PT Plan      Co-evaluation              AM-PAC PT 6 Clicks Mobility   Outcome Measure  Help needed turning from your back to your side while in a flat bed without using bedrails?: None Help needed moving from lying on your back to sitting on the side of a flat bed without using bedrails?: None Help needed moving to and from a bed to a chair (including a wheelchair)?: None Help needed standing up from a chair using your arms (e.g., wheelchair or bedside chair)?: A Little Help needed to walk in hospital room?: A Little Help needed climbing 3-5 steps with a railing? : A Little 6 Click Score: 21    End of Session Equipment Utilized During Treatment: Gait belt Activity Tolerance: Patient tolerated treatment well Patient left: in bed;with bed alarm set Nurse Communication: Mobility status PT Visit Diagnosis: Other abnormalities of gait and mobility (R26.89);Muscle weakness (generalized) (M62.81);Difficulty in walking, not elsewhere classified (R26.2)     Time: 9191-9173 PT Time Calculation (min) (ACUTE ONLY): 18 min  Charges:    $Therapeutic Activity: 8-22 mins PT General Charges $$ ACUTE PT VISIT: 1 Visit                     Sherlean Lesches DPT, PT     Sherlean A Cyree Chuong 02/07/2024, 8:42 AM

## 2024-02-07 NOTE — Progress Notes (Signed)
 DISCHARGE NOTE:  Pt given discharge instructions, and verbalized understanding. Ted hose on both legs. Meds to beds medications, 2 honeycomb dressings, BSC and walker sent with pt. Pt wheeled to car by staff, daughter providing transportation home.

## 2024-02-07 NOTE — Plan of Care (Signed)
   Problem: Activity: Goal: Ability to avoid complications of mobility impairment will improve Outcome: Progressing

## 2024-02-22 NOTE — Progress Notes (Addendum)
 History of Present Illness:   Savannah Griffin is a 73 y.o. female here for   Verbally consented to the use of AI for note-taking.   Chief Complaint  Patient presents with  . Follow-up      History of Present Illness Savannah Griffin is a 73 year old female who presents with severe hemorrhoidal pain. She is accompanied by a daughter who assists her as she cannot drive.  She has been experiencing severe pain from hemorrhoids, describing it as 'a red hot poker going at me.' She has a history of prolapsed hemorrhoids diagnosed in December 2023, which had not been bothersome until recently. The pain is persistent and exacerbated by bowel movements and straining. The hemorrhoid protrudes and requires manual reduction, but frequently recurs.  She transitioned from constipation, initially triggered by ovarian medication, to increased bowel movements. She has tried Dulcolax, Milk of Magnesia, and Metamucil, leading to an overcorrection of her bowel habits. Sitz baths and Preparation H cream have been used for the past two days without relief. No blood in stools is reported, but there is significant discomfort and leakage, necessitating the use of protective pants.  Regarding urinary symptoms, she is uncertain if her urinary discomfort is related to hemorrhoids or bladder issues. No burning with urination or blood in urine is noted. She reports frequent urination, approximately every 30 to 60 minutes, attributing it to hemorrhoidal discomfort.   Past Medical History:   Past Medical History:  Diagnosis Date  . Arthritis   . 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  Tried AI therapy but stopped b/c of side effects.   SABRA GERD (gastroesophageal reflux disease)   . History of radiation therapy   . Rectal polyp 02/27/2013   On colonoscopy 01/08   . 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 DILATION AND CURETTAGE OF UTERUS  1974  . TUBAL LIGATION  1977  . HYSTERECTOMY  1997   TVH, ovaries in situ  . COLONOSCOPY  07/08/1995   Normal Colon  . EGD  07/23/1995  . FOOT SURGERY Bilateral 2000   BONE SCRAPING FOR ARTHRITIS & TOENAIL REMOVAL: DR. BEVERLEY  . BREAST SURGERY Right 2001   lumpectomy,  . ENDOSCOPIC CARPAL TUNNEL RELEASE Bilateral 2003  . TONSILLECTOMY  2007  . COLONOSCOPY  05/16/2006   Hyperplastic Polyp: CBF 04/2016; Recall Ltr mailed 03/17/2016 (dw)  . CATARACT EXTRACTION Bilateral 10/2016  . COLONOSCOPY  07/15/2017   Normal/Repeat in 10 yrs/TKT   . BREAST BIOPSY    . BREAST LUMPECTOMY    . BREAST SURGERY Left 1992, 1997   breast bx    Allergies:   Allergies  Allergen Reactions  . Nitrofurantoin Macrocrystal Other (See Comments)    Headache  . Other Other (See Comments)    Gi-upset, any kind of dairy  . Augmentin [Amoxicillin-Pot Clavulanate] Headache  . Avelox  [Moxifloxacin ] Vomiting  . Cefdinir Headache  . Clarithromycin Vomiting and Other (See Comments)    Gi upset  . Clindamycin Headache  . Doxycycline Headache  . Escitalopram Other (See Comments) and Headache    And shakiness  . Nitrofurantoin Unknown  . Sulfa (Sulfonamide Antibiotics) Unknown  . Sulfamethoxazole-Trimethoprim Headache and Other (See Comments)    Headache    Current Medications:  Prior to Admission medications  Medication Sig Taking? Last Dose  acetaminophen  (TYLENOL ) 500 MG tablet Take 500 mg by mouth every 8 (eight) hours as needed for Pain Yes PRN Not Currently Taking  aspirin 81 MG chewable tablet Take 81 mg by mouth 2 (two) times daily Yes Taking  celecoxib (CELEBREX) 200 MG capsule Take 200 mg by mouth 2 (two) times daily Yes Taking  cyanocobalamin (VITAMIN B12) 1,000 mcg/mL injection Inject into the muscle monthly Every week x 4 then monthly Yes Taking  diclofenac  (VOLTAREN) 1 % topical gel Apply 2 g topically 4 (four) times daily Yes PRN Not Currently Taking  LORazepam  (ATIVAN ) 0.5 MG tablet TAKE 1 TABLET BY MOUTH DAILY AS NEEDED FOR ANXIETY Yes PRN Not Currently Taking  hydrocortisone (ANUSOL-HC) 25 mg suppository Place 1 suppository (25 mg total) rectally 2 (two) times daily for 10 days      Family History:   Family History  Problem Relation Name Age of Onset  . High blood pressure (Hypertension) Mother dx @75    . Hyperlipidemia (Elevated cholesterol) Mother dx @75    . Diabetes Mother dx @75    . Breast cancer Mother dx @75    . Heart disease Father    . Hyperlipidemia (Elevated cholesterol) Father    . Breast cancer Maternal Grandmother dx@66    . Breast cancer Paternal Aunt    . Colon polyps Neg Hx    . Colon cancer Neg Hx    . Ovarian cancer Neg Hx      Social History:   Social History   Socioeconomic History  . Marital status: Divorced  . Number of children: 2  . Years of education: 14  Occupational History    Comment: RETIRED  Tobacco Use  . Smoking status: Never  . Smokeless tobacco: Never  Vaping Use  . Vaping status: Never Used  Substance and Sexual Activity  . Alcohol use: Never  . Drug use: No  . Sexual activity: Not Currently    Partners: Male    Comment: divorced, for 10 yrs   Social Drivers of Corporate Investment Banker Strain: Low Risk  (02/22/2024)   Overall Financial Resource Strain (CARDIA)   . Difficulty of Paying Living Expenses: Not hard at all  Food Insecurity: No Food Insecurity (02/22/2024)   Hunger Vital Sign   . Worried About Programme Researcher, Broadcasting/film/video in the Last Year: Never true   . Ran Out of Food in the Last Year: Never true  Transportation Needs: No Transportation Needs (02/22/2024)   PRAPARE - Transportation   . Lack of Transportation (Medical): No   . Lack of Transportation (Non-Medical): No  Social Connections: Moderately Isolated (02/06/2024)   Received from [facility]   Social Connection  and Isolation Panel   . In a typical week, how many times do you talk on the phone with family, friends, or neighbors?: More than three times a week   . How often do you get together with friends or relatives?: Three times a week   . How often do you attend church or religious services?: More than 4 times per year   . Do you belong to any clubs or organizations such as church groups, unions, fraternal or athletic groups, or school groups?: No   . How often do you attend meetings of the clubs or organizations you belong to?: Never   . Are you married, widowed, divorced, separated, never married, or living with a partner?: Divorced  Housing Stability: Low Risk  (  02/22/2024)   Housing Stability Vital Sign   . Unable to Pay for Housing in the Last Year: No   . Number of Times Moved in the Last Year: 1   . Homeless in the Last Year: No    Review of Systems:   A 10 point review of systems is negative, except for the pertinent positives and negatives detailed in the HPI.  Vitals:   Vitals:   02/22/24 0902  BP: 92/56  Pulse: 104  SpO2: 99%  Weight: 60.5 kg (133 lb 6.4 oz)  Height: 157.5 cm (5' 2)     Body mass index is 24.4 kg/m.  Physical Exam:   Physical Exam Vitals and nursing note reviewed.  Constitutional:      General: She is not in acute distress.    Appearance: Normal appearance. She is not ill-appearing, toxic-appearing or diaphoretic.  HENT:     Head: Normocephalic and atraumatic.     Right Ear: External ear normal.     Left Ear: External ear normal.  Eyes:     Conjunctiva/sclera: Conjunctivae normal.  Cardiovascular:     Rate and Rhythm: Normal rate and regular rhythm.     Pulses: Normal pulses.     Heart sounds: Normal heart sounds.  Pulmonary:     Effort: Pulmonary effort is normal.     Breath sounds: Normal breath sounds.  Genitourinary:    Comments: No external hemorrhoid noted no fissure noted.  Suspect internal hemorrhoid Neurological:     Mental  Status: She is alert.    Amy Steagall was used to chaperone  Assessment and Plan:  No results found for this visit on 02/22/24.   Assessment & Plan Hemorrhoids Prolapsed internal hemorrhoid with significant pain, likely exacerbated by recent surgery and constipation. Current management ineffective. - Continue sitz baths with warm water. - Prescribe hydrocortisone 2.5% suppository to reduce inflammation. - Refer to General Surgery for evaluation and potential treatment options such as ligation or sclerotherapy.  Dysuria Unclear etiology. Differential includes UTI versus irritation from hemorrhoids. - Perform urinalysis and urine culture.  -Abnormal urinalysis.  Will call in Keflex  for 5 days.  Patient states she is taking Keflex  and past without issue - If urinalysis is abnormal, consider prescribing antibiotics such as Keflex , considering her antibiotic allergies.  Disposition: Follow-up as needed  Patient Instructions  Will get some urine today.  I have also placed hydrocortisone suppository to be sent to the pharmacy for you to use.  I also placed a referral to general surgery to follow-up with.  Reach out if you are not improving before you see general surgery.   This note has been created using automated tools and reviewed for accuracy by provider.  Patient received an After Visit Summary    Attestation Statement:   I personally performed the service, non-incident to. (WP)   MASON MCCLELLAND MINOR, PA

## 2024-02-24 ENCOUNTER — Observation Stay
Admission: EM | Admit: 2024-02-24 | Discharge: 2024-02-25 | Disposition: A | Discharge status: Home / Self Care | Attending: Internal Medicine | Admitting: Internal Medicine

## 2024-02-24 ENCOUNTER — Emergency Department

## 2024-02-24 ENCOUNTER — Other Ambulatory Visit: Payer: Self-pay

## 2024-02-24 DIAGNOSIS — K219 Gastro-esophageal reflux disease without esophagitis: Secondary | ICD-10-CM | POA: Insufficient documentation

## 2024-02-24 DIAGNOSIS — Z7982 Long term (current) use of aspirin: Secondary | ICD-10-CM | POA: Diagnosis not present

## 2024-02-24 DIAGNOSIS — N3000 Acute cystitis without hematuria: Principal | ICD-10-CM

## 2024-02-24 DIAGNOSIS — K6289 Other specified diseases of anus and rectum: Secondary | ICD-10-CM | POA: Insufficient documentation

## 2024-02-24 DIAGNOSIS — I700 Atherosclerosis of aorta: Secondary | ICD-10-CM | POA: Insufficient documentation

## 2024-02-24 DIAGNOSIS — N390 Urinary tract infection, site not specified: Secondary | ICD-10-CM | POA: Diagnosis not present

## 2024-02-24 DIAGNOSIS — K610 Anal abscess: Principal | ICD-10-CM | POA: Diagnosis present

## 2024-02-24 DIAGNOSIS — N200 Calculus of kidney: Secondary | ICD-10-CM | POA: Insufficient documentation

## 2024-02-24 LAB — CBC
HCT: 37 % (ref 36.0–46.0)
Hemoglobin: 12 g/dL (ref 12.0–15.0)
MCH: 30.2 pg (ref 26.0–34.0)
MCHC: 32.4 g/dL (ref 30.0–36.0)
MCV: 93.2 fL (ref 80.0–100.0)
Platelets: 404 K/uL — ABNORMAL HIGH (ref 150–400)
RBC: 3.97 MIL/uL (ref 3.87–5.11)
RDW: 12.4 % (ref 11.5–15.5)
WBC: 8.7 K/uL (ref 4.0–10.5)
nRBC: 0 % (ref 0.0–0.2)

## 2024-02-24 LAB — URINALYSIS, W/ REFLEX TO CULTURE (INFECTION SUSPECTED)
Bilirubin Urine: NEGATIVE
Glucose, UA: NEGATIVE mg/dL
Ketones, ur: NEGATIVE mg/dL
Nitrite: NEGATIVE
Protein, ur: 30 mg/dL — AB
Specific Gravity, Urine: 1.015 (ref 1.005–1.030)
WBC, UA: >50 WBC/hpf (ref 0–5)
pH: 7 (ref 5.0–8.0)

## 2024-02-24 LAB — BASIC METABOLIC PANEL WITH GFR
Anion gap: 14 (ref 5–15)
BUN: 24 mg/dL — ABNORMAL HIGH (ref 8–23)
CO2: 26 mmol/L (ref 22–32)
Calcium: 9 mg/dL (ref 8.9–10.3)
Chloride: 103 mmol/L (ref 98–111)
Creatinine, Ser: 0.6 mg/dL (ref 0.44–1.00)
GFR, Estimated: >60 mL/min (ref >60)
Glucose, Bld: 112 mg/dL — ABNORMAL HIGH (ref 70–99)
Potassium: 4.9 mmol/L (ref 3.5–5.1)
Sodium: 143 mmol/L (ref 135–145)

## 2024-02-24 MED ORDER — SODIUM CHLORIDE 0.9 % IV SOLN
2.0000 g | Freq: Once | INTRAVENOUS | Status: AC
  Administered 2024-02-24: 2 g via INTRAVENOUS
  Filled 2024-02-24: qty 20

## 2024-02-24 MED ORDER — LORAZEPAM 0.5 MG PO TABS
0.5000 mg | ORAL_TABLET | Freq: Three times a day (TID) | ORAL | Status: DC | PRN
Start: 2024-02-24 — End: 2024-02-25

## 2024-02-24 MED ORDER — MORPHINE SULFATE (PF) 2 MG/ML IV SOLN: 2.0000 mg | INTRAVENOUS | Status: AC | PRN

## 2024-02-24 MED ORDER — SENNOSIDES-DOCUSATE SODIUM 8.6-50 MG PO TABS
1.0000 | ORAL_TABLET | Freq: Two times a day (BID) | ORAL | Status: DC
Start: 2024-02-24 — End: 2024-02-29
  Administered 2024-02-24 – 2024-02-25 (×2): 1 via ORAL
  Filled 2024-02-24 (×2): qty 1

## 2024-02-24 MED ORDER — SENNOSIDES-DOCUSATE SODIUM 8.6-50 MG PO TABS: 1.0000 | ORAL_TABLET | Freq: Every evening | ORAL | Status: DC | PRN

## 2024-02-24 MED ORDER — ACETAMINOPHEN 500 MG PO TABS
1000.0000 mg | ORAL_TABLET | Freq: Once | ORAL | Status: AC
  Administered 2024-02-24: 1000 mg via ORAL
  Filled 2024-02-24: qty 2

## 2024-02-24 MED ORDER — SODIUM CHLORIDE 0.9 % IV BOLUS
1000.0000 mL | Freq: Once | INTRAVENOUS | Status: AC
  Administered 2024-02-24: 1000 mL via INTRAVENOUS

## 2024-02-24 MED ORDER — ONDANSETRON HCL 4 MG PO TABS: 4.0000 mg | ORAL_TABLET | Freq: Four times a day (QID) | ORAL | Status: DC | PRN

## 2024-02-24 MED ORDER — HEPARIN SODIUM (PORCINE) 5000 UNIT/ML IJ SOLN
5000.0000 [IU] | Freq: Three times a day (TID) | INTRAMUSCULAR | Status: DC
Start: 2024-02-24 — End: 2024-02-25
  Administered 2024-02-24 – 2024-02-25 (×2): 5000 [IU] via SUBCUTANEOUS
  Filled 2024-02-24 (×2): qty 1

## 2024-02-24 MED ORDER — IOHEXOL 300 MG/ML  SOLN
100.0000 mL | Freq: Once | INTRAMUSCULAR | Status: AC | PRN
  Administered 2024-02-24: 100 mL via INTRAVENOUS

## 2024-02-24 MED ORDER — SODIUM CHLORIDE 0.9 % IV SOLN
2.0000 g | INTRAVENOUS | Status: DC
  Administered 2024-02-25: 2 g via INTRAVENOUS
  Filled 2024-02-24: qty 20

## 2024-02-24 MED ORDER — DICLOFENAC SODIUM 1 % EX GEL: 2.0000 g | Freq: Every day | CUTANEOUS | Status: DC | PRN

## 2024-02-24 MED ORDER — OXYCODONE HCL 5 MG PO TABS: 5.0000 mg | ORAL_TABLET | Freq: Four times a day (QID) | ORAL | Status: AC | PRN

## 2024-02-24 MED ORDER — LACTATED RINGERS IV SOLN: INTRAVENOUS | Status: DC

## 2024-02-24 MED ORDER — METRONIDAZOLE 500 MG/100ML IV SOLN
500.0000 mg | Freq: Two times a day (BID) | INTRAVENOUS | Status: DC
  Administered 2024-02-24 – 2024-02-25 (×2): 500 mg via INTRAVENOUS
  Filled 2024-02-24 (×2): qty 100

## 2024-02-24 MED ORDER — METRONIDAZOLE 500 MG/100ML IV SOLN
500.0000 mg | Freq: Once | INTRAVENOUS | Status: AC
Start: 2024-02-24 — End: 2024-02-24
  Administered 2024-02-24: 500 mg via INTRAVENOUS
  Filled 2024-02-24: qty 100

## 2024-02-24 MED ORDER — ACETAMINOPHEN 650 MG RE SUPP: 650.0000 mg | Freq: Four times a day (QID) | RECTAL | Status: DC | PRN

## 2024-02-24 MED ORDER — ONDANSETRON HCL 4 MG/2ML IJ SOLN: 4.0000 mg | Freq: Four times a day (QID) | INTRAMUSCULAR | Status: DC | PRN

## 2024-02-24 MED ORDER — PANTOPRAZOLE SODIUM 40 MG PO TBEC: 40.0000 mg | DELAYED_RELEASE_TABLET | Freq: Every day | ORAL | Status: DC | PRN

## 2024-02-24 MED ORDER — SODIUM CHLORIDE 0.9 % IV SOLN: 1.0000 g | INTRAVENOUS | Status: DC

## 2024-02-24 MED ORDER — ACETAMINOPHEN 325 MG PO TABS: 650.0000 mg | ORAL_TABLET | Freq: Four times a day (QID) | ORAL | Status: DC | PRN

## 2024-02-24 NOTE — Assessment & Plan Note (Addendum)
 With severe pain continue with metronidazole 500 mg IV twice daily General Surgery has been consulted Oxycodone  5 mg p.o. every 6 hours as needed for moderate pain, 1 day ordered; morphine  2 mg IV every 4 hours as needed for severe pain, 20 hours of coverage ordered N.p.o. pending general surgery evaluation

## 2024-02-24 NOTE — Plan of Care (Signed)
  Problem: Clinical Measurements: Goal: Ability to maintain clinical measurements within normal limits will improve Outcome: Progressing   Problem: Clinical Measurements: Goal: Will remain free from infection Outcome: Progressing   Problem: Pain Managment: Goal: General experience of comfort will improve and/or be controlled Outcome: Progressing   Problem: Safety: Goal: Ability to remain free from injury will improve Outcome: Progressing

## 2024-02-24 NOTE — H&P (Signed)
 History and Physical   Savannah Griffin FMW:[id number] DOB: 19-Jun-1950 DOA: 02/24/2024  PCP: Cyrus Selinda Moose, PA-C  Patient coming from: Home  I have personally briefly reviewed patient's old medical records in [facility] EMR.  Chief Concern: Severe anal pain  HPI: Savannah Griffin is a 73 year old female with history of hemorrhoids, GERD, rectal polyp, vocal cord polyp, history of right sided breast cancer who presents to the ED for chief concerns of severe anal pain.  Vitals in the ED showed t of 98.3, rr 16, hr 91, blood pressure 128/74, SpO2 100% on room air.  Serum sodium is 143, potassium 4.9, chloride 103, bicarb 26, BUN of 24, serum creatinine 0.60, eGFR greater than 60, nonfasting glucose 112, WBC 8.7, hemoglobin 12, platelets of 404.  UA was positive for large leukocytes.  CT abdomen pelvis with contrast: Was read as heterogenous enhancement at the level of the anal sphincter measuring 3.7 x 1.8 cm encircling the anal sphincter from 7-2 o'clock suspicious for perianal abscess.  Small focus of gas immediately anterior to the fluid collection at the skin surface at the perineum would be amendable to direct inspection.  Mildly dilated rectum contains large volume stool likely fecal impaction.  ED treatment: Flagyl 500 mg IV one-time dose, ceftriaxone  2 g IV one-time dose, sodium chloride  1 L bolus. ---------------------------------------- At bedside, patient is able to tell me her first and last name, age, location, current calendar year.  She reports that the area surrounding her anus has been hurting for about 1 week.  She denies fever, chills, cough, chest pain, shortness of breath, abdominal pain, dysuria, hematuria, swelling of her lower extremity, syncope.  She reports that after she had knee surgery, she was placed on pain medication and she was taking stool softener however stopped because she developed diarrhea.  Now she is constipated.  Social history: She lives at  home.  She denies tobacco, EtOH, recreational drug use.  ROS: Constitutional: no weight change, no fever ENT/Mouth: no sore throat, no rhinorrhea Eyes: no eye pain, no vision changes Cardiovascular: no chest pain, no dyspnea,  no edema, no palpitations Respiratory: no cough, no sputum, no wheezing Gastrointestinal: no nausea, no vomiting, no diarrhea, + constipation Genitourinary: no urinary incontinence, no dysuria, no hematuria Musculoskeletal: no arthralgias, no myalgias Skin: no skin lesions, no pruritus, Neuro: + weakness, no loss of consciousness, no syncope Psych: no anxiety, no depression, + decrease appetite Heme/Lymph: no bruising, no bleeding  ED Course: Discussed with EDP, patient requiring hospitalization for chief concerns of perianal abscess.  Assessment/Plan  Principal Problem:   Perianal abscess Active Problems:   UTI (urinary tract infection)   GERD (gastroesophageal reflux disease)   Perianal pain   Assessment and Plan:  * Perianal abscess With severe pain continue with metronidazole 500 mg IV twice daily General Surgery has been consulted Oxycodone  5 mg p.o. every 6 hours as needed for moderate pain, 1 day ordered; morphine  2 mg IV every 4 hours as needed for severe pain, 20 hours of coverage ordered N.p.o. pending general surgery evaluation  Perianal pain Suspect secondary to perianal abscess Treatment per above Symptomatic support: Oxycodone  5 mg p.o. every 6 hours as needed for moderate pain, 1 day ordered; morphine  2 mg IV every 4 hours as needed for severe pain, 20 hours of coverage ordered  GERD (gastroesophageal reflux disease) As needed Protonix  UTI (urinary tract infection) Patient is on Keflex , and has taken 1 day of antibiotic outpatient Continue ceftriaxone  2 g  IV daily, to complete 7-day course  Chart reviewed.   DVT prophylaxis: Heparin 5000 units subcutaneous q. did hours Code Status: Full code Diet: N.p.o. pending general surgery  evaluation Family Communication: Updated daughter, Harlene at bedside with patient's permission Disposition Plan: Pending clinical course Consults called: General Surgery Admission status: MedSurg, inpatient  Past Medical History:  Diagnosis Date   Arthritis    Cancer (HCC)    BREAST   Complication of anesthesia    NO RECENT N/V   Family history of adverse reaction to anesthesia    dad-n/v   Finger mass, right    thumb   GERD (gastroesophageal reflux disease)    Hepatitis 1980   unsure if A or B   PONV (postoperative nausea and vomiting)    H/O  NONE WITH RECENT SURGIES   Past Surgical History:  Procedure Laterality Date   ABDOMINAL HYSTERECTOMY     BREAST SURGERY Right    BX/ LUMPECTOMY with sentinel lymph biopsy 2001   CARPAL TUNNEL RELEASE Bilateral    CATARACT EXTRACTION W/PHACO Left 10/26/2016   Procedure: CATARACT EXTRACTION PHACO AND INTRAOCULAR LENS PLACEMENT (IOC);  Surgeon: Jaye Fallow, MD;  Location: [facility] ORS;  Service: Ophthalmology;  Laterality: Left;  US  00:43 AP% 12.0 CDE 5.20 Fluid pack lot # [id number]   CATARACT EXTRACTION W/PHACO Right 11/16/2016   Procedure: CATARACT EXTRACTION PHACO AND INTRAOCULAR LENS PLACEMENT (IOC);  Surgeon: Jaye Fallow, MD;  Location: [facility] ORS;  Service: Ophthalmology;  Laterality: Right;  US  00:45.9 AP% 13.4 CDE 6.16 Fluid Pack lot # [id number]   COLONOSCOPY     DILATION AND CURETTAGE OF UTERUS     KNEE ARTHROPLASTY Right 02/06/2024   Procedure: ARTHROPLASTY, KNEE, TOTAL, USING IMAGELESS COMPUTER-ASSISTED NAVIGATION;  Surgeon: Mardee Lynwood SQUIBB, MD;  Location: [facility] ORS;  Service: Orthopedics;  Laterality: Right;   Needle Core Biopsy     TONSILLECTOMY     age 22   TUBAL LIGATION     Social History:  reports that she has never smoked. She has never used smokeless tobacco. She reports that she does not drink alcohol and does not use drugs.  Allergies  Allergen Reactions   Augmentin [Amoxicillin-Pot Clavulanate] Other  (See Comments)    Headache and GI upset   Avelox  [Moxifloxacin ] Other (See Comments)    Headache and GI upset    Biaxin [Clarithromycin] Other (See Comments)    Gi upset   Cefdinir Other (See Comments)    Gi-Upset   Clindamycin/Lincomycin Other (See Comments)    GI upset    Doxycycline Other (See Comments)     GI upset    Gluten Meal Other (See Comments)    Headache and   Lactose Intolerance (Gi) Other (See Comments)    Gi-upset, any kind of dairy   Macrodantin [Nitrofurantoin Macrocrystal] Other (See Comments)    Headache     Septra [Sulfamethoxazole-Trimethoprim] Other (See Comments)    Headache   Escitalopram Other (See Comments)    And shakiness   Family History  Problem Relation Age of Onset   Hyperlipidemia Mother    Hypertension Mother    Diabetes Mother    Hypertension Father    Heart disease Father    Family history: Family history reviewed and not pertinent.  Prior to Admission medications   Medication Sig Start Date End Date Taking? Authorizing Provider  acetaminophen  (TYLENOL ) 500 MG tablet Take 1,000 mg by mouth daily.    [provider]  aspirin 81 MG chewable tablet Chew 1  tablet (81 mg total) by mouth 2 (two) times daily. 02/07/24   Drake Chew, PA-C  celecoxib (CELEBREX) 200 MG capsule Take 1 capsule (200 mg total) by mouth 2 (two) times daily. 02/07/24   Drake Chew, PA-C  cyanocobalamin (VITAMIN B12) 1000 MCG/ML injection Inject 1,000 mcg into the muscle every 30 (thirty) days.    [provider]  diclofenac Sodium (VOLTAREN) 1 % GEL Apply 2 g topically daily as needed (Knee pain).    [provider]  LORazepam  (ATIVAN ) 0.5 MG tablet Take 0.5 mg by mouth every 8 (eight) hours as needed for anxiety.    [provider]  ondansetron  (ZOFRAN -ODT) 4 MG disintegrating tablet Take 1 tablet (4 mg total) by mouth every 8 (eight) hours as needed for nausea or vomiting. 04/20/23   Dicky Anes, MD  oxyCODONE  (OXY  IR/ROXICODONE ) 5 MG immediate release tablet Take 1 tablet (5 mg total) by mouth every 4 (four) hours as needed for moderate pain (pain score 4-6) (pain score 4-6). 02/07/24   Drake Chew, PA-C  oxyCODONE -acetaminophen  (PERCOCET/ROXICET) 5-325 MG tablet Take 1 tablet by mouth every 4 (four) hours as needed for severe pain (pain score 7-10). Patient taking differently: Take 1 tablet by mouth every 4 (four) hours as needed (pancreas). 04/20/23   Dicky Anes, MD  traMADol (ULTRAM) 50 MG tablet Take 1-2 tablets (50-100 mg total) by mouth every 4 (four) hours as needed for moderate pain (pain score 4-6). 02/07/24   Drake Chew, PA-C   Physical Exam: Vitals:   02/24/24 0841 02/24/24 0843  BP: 128/74   Pulse: 91   Resp: 16   Temp: 98.3 F (36.8 C)   TempSrc: Oral   SpO2: 100%   Weight:  59 kg  Height:  5' 2 (1.575 m)   Constitutional: appears appropriate appropriate, NAD, calm Eyes: PERRL, lids and conjunctivae normal ENMT: Mucous membranes are moist. Posterior pharynx clear of any exudate or lesions. Age-appropriate dentition. Hearing appropriate Neck: normal, supple, no masses, no thyromegaly Respiratory: clear to auscultation bilaterally, no wheezing, no crackles. Normal respiratory effort. No accessory muscle use.  Cardiovascular: Regular rate and rhythm, no murmurs / rubs / gallops. No extremity edema. 2+ pedal pulses. No carotid bruits.  Abdomen: no tenderness, no masses palpated, no hepatosplenomegaly. Bowel sounds positive.  Musculoskeletal: no clubbing / cyanosis. No joint deformity upper and lower extremities. Good ROM, no contractures, no atrophy. Normal muscle tone.  Skin: no rashes, lesions, ulcers. No induration Neurologic: Sensation intact. Strength 5/5 in all 4.  Psychiatric: Normal judgment and insight. Alert and oriented x 3. Normal mood.   EKG: Not indicated at this time  Chest x-ray on Admission: Not indicated at this time  CT ABDOMEN PELVIS W  CONTRAST Result Date: 02/24/2024 CLINICAL DATA:  Lower abdominal pain and constipation. Concern for perianal/rectal abscess EXAM: CT ABDOMEN AND PELVIS WITH CONTRAST TECHNIQUE: Multidetector CT imaging of the abdomen and pelvis was performed using the standard protocol following bolus administration of intravenous contrast. RADIATION DOSE REDUCTION: This exam was performed according to the departmental dose-optimization program which includes automated exposure control, adjustment of the mA and/or kV according to patient size and/or use of iterative reconstruction technique. CONTRAST:  OMNIPAQUE  IOHEXOL  300 MG/ML  SOLN COMPARISON:  CT abdomen and pelvis dated 04/18/2023 FINDINGS: Lower chest: Unchanged 5 mm subpleural lingular nodule (4:6) and 3 mm peripheral left lower lobe nodule (4:27). No pleural effusion or pneumothorax demonstrated. Partially imaged heart size is normal. Hepatobiliary: Scattered subcentimeter hypodensities, too small  to characterize. No intra or extrahepatic biliary ductal dilation. Normal gallbladder. Pancreas: Punctate calcifications in the pancreatic head (2:30), extraluminal to the common bile duct. No main pancreatic ductal dilation. Spleen: Normal in size without focal abnormality. Adrenals/Urinary Tract: No adrenal nodules. Simple/minimally complicated interpolar right renal cyst measures 1.6 cm (2:28). No specific follow-up imaging recommended. Additional bilateral simple-appearing parapelvic cysts. 2 mm nonobstructing stone within the proximal right ureter (2:38). No hydronephrosis. Underdistended urinary bladder demonstrates mild pericystic stranding. Stomach/Bowel: Normal appearance of the stomach. Mildly dilated rectum contains large volume stool. Heterogeneous enhancement at the level of the anal sphincter, where there is a crescentic fluid collection measuring 3.7 x 1.8 cm encircling the anal sphincter from 7 to 2 o'clock (2:90). No CT evidence of fistulization to the  rectum or anus. Small focus of gas immediately anterior to the fluid collection (2:90) at the skin surface. Colonic diverticulosis without acute diverticulitis. Appendix is not discretely seen. Vascular/Lymphatic: Aortic atherosclerosis. No enlarged abdominal or pelvic lymph nodes. Reproductive: No adnexal masses. Other: Small volume presacral edema.  No free air. Musculoskeletal: No acute or abnormal lytic or blastic osseous lesions. IMPRESSION: 1. Heterogeneous enhancement at the level of the anal sphincter, where there is a crescentic fluid collection measuring 3.7 x 1.8 cm encircling the anal sphincter from 7 to 2 o'clock, suspicious for perianal abscess. No CT evidence of fistulization to the rectum or anus. Small focus of gas immediately anterior to the fluid collection at the skin surface of the perineum would be amenable to direct inspection. 2. Mildly dilated rectum contains large volume stool, likely fecal impaction. 3. Underdistended urinary bladder demonstrates mild pericystic stranding. Recommend correlation with urinalysis to exclude cystitis. 4. A 2 mm nonobstructing stone within the proximal right ureter. No hydronephrosis. 5.  Aortic Atherosclerosis (ICD10-I70.0). Electronically Signed   By: Limin  Xu [name]   On: 02/24/2024 12:11   Labs on Admission: I have personally reviewed following labs  CBC: Recent Labs  Lab 02/24/24 0902  WBC 8.7  HGB 12.0  HCT 37.0  MCV 93.2  PLT 404*   Basic Metabolic Panel: Recent Labs  Lab 02/24/24 0902  NA 143  K 4.9  CL 103  CO2 26  GLUCOSE 112*  BUN 24*  CREATININE 0.60  CALCIUM 9.0   GFR: Estimated Creatinine Clearance: 51.8 mL/min (by C-G formula based on SCr of 0.6 mg/dL).  Urine analysis:    Component Value Date/Time   COLORURINE YELLOW (A) 02/24/2024 0953   APPEARANCEUR CLOUDY (A) 02/24/2024 0953   LABSPEC 1.015 02/24/2024 0953   PHURINE 7.0 02/24/2024 0953   GLUCOSEU NEGATIVE 02/24/2024 0953   HGBUR LARGE (A) 02/24/2024 0953    BILIRUBINUR NEGATIVE 02/24/2024 0953   KETONESUR NEGATIVE 02/24/2024 0953   PROTEINUR 30 (A) 02/24/2024 0953   NITRITE NEGATIVE 02/24/2024 0953   LEUKOCYTESUR LARGE (A) 02/24/2024 0953   This document was prepared using Dragon Voice Recognition software and may include unintentional dictation errors.  Dr. Sherre [facility]  If 7PM-7AM, please contact overnight-coverage provider If 7AM-7PM, please contact day attending provider [email or web address]  02/24/2024, 1:37 PM

## 2024-02-24 NOTE — Assessment & Plan Note (Signed)
 Patient is on Keflex , and has taken 1 day of antibiotic outpatient Continue ceftriaxone  2 g IV daily, to complete 7-day course

## 2024-02-24 NOTE — Assessment & Plan Note (Signed)
 Suspect secondary to perianal abscess Treatment per above Symptomatic support: Oxycodone  5 mg p.o. every 6 hours as needed for moderate pain, 1 day ordered; morphine  2 mg IV every 4 hours as needed for severe pain, 20 hours of coverage ordered

## 2024-02-24 NOTE — Hospital Course (Addendum)
 [name] is a 73 year old female with history of hemorrhoids, GERD, rectal polyp, vocal cord polyp, history of right sided breast cancer who presents to the ED for chief concerns of severe anal pain.  Vitals in the ED showed t of 98.3, rr 16, hr 91, blood pressure 128/74, SpO2 100% on room air.  Serum sodium is 143, potassium 4.9, chloride 103, bicarb 26, BUN of 24, serum creatinine 0.60, eGFR greater than 60, nonfasting glucose 112, WBC 8.7, hemoglobin 12, platelets of 404.  UA was positive for large leukocytes.  CT abdomen pelvis with contrast: Was read as heterogenous enhancement at the level of the anal sphincter measuring 3.7 x 1.8 cm encircling the anal sphincter from 7-2 o'clock suspicious for perianal abscess.  Small focus of gas immediately anterior to the fluid collection at the skin surface at the perineum would be amendable to direct inspection.  Mildly dilated rectum contains large volume stool likely fecal impaction.  ED treatment: Flagyl 500 mg IV one-time dose, ceftriaxone  2 g IV one-time dose, sodium chloride  1 L bolus.

## 2024-02-24 NOTE — Consult Note (Signed)
 Patient ID: Savannah Griffin, female   DOB: [birth year], 73 y.o.   MRN: [id number] CC: Perirectal abscess History of Present Illness Savannah Griffin is a 73 y.o. female with .  Past medical history as below who is status post right knee replacement 2-1/2 weeks ago who presents in consultation for rectal pain.  The patient reports that over the last week she has had pain in her rectum.  She denies any fevers or chills.  She denies any blood in her stool or purulent drainage from her rectum or surrounding area.  She says that she has also had incontinence over the same time.  Past Medical History Past Medical History:  Diagnosis Date   Arthritis    Cancer (HCC)    BREAST   Complication of anesthesia    NO RECENT N/V   Family history of adverse reaction to anesthesia    dad-n/v   Finger mass, right    thumb   GERD (gastroesophageal reflux disease)    Hepatitis 1980   unsure if A or B   PONV (postoperative nausea and vomiting)    H/O  NONE WITH RECENT SURGIES       Past Surgical History:  Procedure Laterality Date   ABDOMINAL HYSTERECTOMY     BREAST SURGERY Right    BX/ LUMPECTOMY with sentinel lymph biopsy 2001   CARPAL TUNNEL RELEASE Bilateral    CATARACT EXTRACTION W/PHACO Left 10/26/2016   Procedure: CATARACT EXTRACTION PHACO AND INTRAOCULAR LENS PLACEMENT (IOC);  Surgeon: Jaye Fallow, MD;  Location: [facility] ORS;  Service: Ophthalmology;  Laterality: Left;  US  00:43 AP% 12.0 CDE 5.20 Fluid pack lot # [id number]   CATARACT EXTRACTION W/PHACO Right 11/16/2016   Procedure: CATARACT EXTRACTION PHACO AND INTRAOCULAR LENS PLACEMENT (IOC);  Surgeon: Jaye Fallow, MD;  Location: [facility] OR[facility];  Service: Ophthalmology;  Laterality: Right;  US  00:45.9 AP% 13.4 CDE 6.16 Fluid Pack lot # [id number]   COLONOSCOPY     DILATION AND CURETTAGE OF UTERUS     KNEE ARTHROPLASTY Right 02/06/2024   Procedure: ARTHROPLASTY, KNEE, TOTAL, USING IMAGELESS COMPUTER-ASSISTED NAVIGATION;  Surgeon: Mardee Lynwood SQUIBB, MD;  Location: [facility] OR[facility];  Service: Orthopedics;  Laterality: Right;   Needle Core Biopsy     TONSILLECTOMY     age 81   TUBAL LIGATION      Allergies  Allergen Reactions   Augmentin [Amoxicillin-Pot Clavulanate] Other (See Comments)    Headache and GI upset   Avelox  [Moxifloxacin ] Other (See Comments)    Headache and GI upset    Biaxin [Clarithromycin] Other (See Comments)    Gi upset   Cefdinir Other (See Comments)    Gi-Upset   Clindamycin/Lincomycin Other (See Comments)    GI upset    Doxycycline Other (See Comments)     GI upset    Gluten Meal Other (See Comments)    Headache and   Lactose Intolerance (Gi) Other (See Comments)    Gi-upset, any kind of dairy   Macrodantin [Nitrofurantoin Macrocrystal] Other (See Comments)    Headache     Septra [Sulfamethoxazole-Trimethoprim] Other (See Comments)    Headache   Escitalopram Other (See Comments)    And shakiness    Current Facility-Administered Medications  Medication Dose Route Frequency Provider Last Rate Last Admin   acetaminophen  (TYLENOL ) tablet 650 mg  650 mg Oral Q6H PRN Cox, Amy N, DO       Or   acetaminophen  (TYLENOL ) suppository 650 mg  650 mg Rectal Q6H PRN Cox,  Amy N, DO       [START ON 02/25/2024] cefTRIAXone  (ROCEPHIN ) 2 g in sodium chloride  0.9 % 100 mL IVPB  2 g Intravenous Q24H Cox, Amy N, DO       heparin injection 5,000 Units  5,000 Units Subcutaneous Q8H Cox, Amy N, DO       lactated ringers infusion   Intravenous Continuous Cox, Amy N, DO 125 mL/hr at 02/24/24 1440 New Bag at 02/24/24 1440   metroNIDAZOLE (FLAGYL) IVPB 500 mg  500 mg Intravenous Q12H Cox, Amy N, DO       morphine  (PF) 2 MG/ML injection 2 mg  2 mg Intravenous Q4H PRN Cox, Amy N, DO       ondansetron  (ZOFRAN ) tablet 4 mg  4 mg Oral Q6H PRN Cox, Amy N, DO       Or   ondansetron  (ZOFRAN ) injection 4 mg  4 mg Intravenous Q6H PRN Cox, Amy N, DO       oxyCODONE  (Oxy IR/ROXICODONE ) immediate release tablet 5 mg  5 mg Oral Q6H PRN Cox,  Amy N, DO       pantoprazole (PROTONIX) EC tablet 40 mg  40 mg Oral Daily PRN Cox, Amy N, DO       senna-docusate (Senokot-S) tablet 1 tablet  1 tablet Oral QHS PRN Cox, Amy N, DO        Family History Family History  Problem Relation Age of Onset   Hyperlipidemia Mother    Hypertension Mother    Diabetes Mother    Hypertension Father    Heart disease Father        Social History Social History   Tobacco Use   Smoking status: Never   Smokeless tobacco: Never  Vaping Use   Vaping status: Never Used  Substance Use Topics   Alcohol use: No   Drug use: Never        ROS Full ROS of systems performed and is otherwise negative there than what is stated in the HPI  Physical Exam Blood pressure (!) 129/57, pulse 88, temperature 98.9 F (37.2 C), temperature source Oral, resp. rate 18, height 5' 2 (1.575 m), weight 59 kg, SpO2 99%.  Alert and oriented x 3, no work of breathing room air, regular rate and rhythm, abdomen soft, nontender nondistended, exam performed.  On external exam there is no evidence of external hemorrhoids, there is some pain anteriorly surrounding the sphincter but there is no fluctuance or spreading erythema, on digital rectal exam there is a large stool burden within the rectal vault that I attempted to evacuate, there is no gross blood or purulence.  Data Reviewed Labs significant for normal white count.  CT scan personally reviewed by me.  There is some inflammation around the sphincter complex but I do not see any contained fluid collection and there is no fluctuance on exam.  I have personally reviewed the patient's imaging and medical records.    Assessment/Plan    Patient with rectal pain and inflammatory process surrounding the sphincter muscle.  On exam there is no area of fluctuance I think would be beneficial to drain at this time.  I would recommend stool softener and laxative as needed for constipation.  Continue on current antibiotic therapy.   We will continue to monitor her as I did discuss with her that it may coalesce into an abscess.  She may use sitz bath's as needed for pain.     Jayson MALVA Endow 02/24/2024, 4:24 PM

## 2024-02-24 NOTE — Assessment & Plan Note (Signed)
As needed Protonix

## 2024-02-24 NOTE — ED Provider Notes (Signed)
 [facility] Provider Note    Event Date/Time   First MD Initiated Contact with Patient 02/24/24 [phone]     (approximate)   History   Hemorrhoids   HPI  Savannah Griffin is a 73 y.o. female past medical history significant for recent right knee replacement who presents to the emergency department with rectal pain.  Patient states that she was recently diagnosed with urinary tract infection and with constipation and hemorrhoids secondary to pain medications from her recent surgery.  States that she is having severe pain to her rectum and feels like something is going to fall out.  Does endorse dysuria.  Denies fever or chills.     Physical Exam   Triage Vital Signs: ED Triage Vitals  Encounter Vitals Group     BP      Girls Systolic BP Percentile      Girls Diastolic BP Percentile      Boys Systolic BP Percentile      Boys Diastolic BP Percentile      Pulse      Resp      Temp      Temp src      SpO2      Weight      Height      Head Circumference      Peak Flow      Pain Score      Pain Loc      Pain Education      Exclude from Growth Chart     Most recent vital signs: Vitals:   02/24/24 0841  BP: 128/74  Pulse: 91  Resp: 16  Temp: 98.3 F (36.8 C)  SpO2: 100%    Physical Exam Exam conducted with a chaperone present.  Constitutional:      Appearance: She is well-developed.  HENT:     Head: Atraumatic.  Eyes:     Conjunctiva/sclera: Conjunctivae normal.  Cardiovascular:     Rate and Rhythm: Regular rhythm.  Pulmonary:     Effort: No respiratory distress.  Abdominal:     General: There is no distension.     Tenderness: There is no abdominal tenderness.  Genitourinary:    Comments: Rectal exam with no findings concerning for hemorrhoids.  Significant tenderness to palpation perianally with concern for fluctuance.  Large stool ball. Musculoskeletal:        General: Normal range of motion.     Cervical back: Normal range of  motion.  Skin:    General: Skin is warm.  Neurological:     Mental Status: She is alert. Mental status is at baseline.     IMPRESSION / MDM / ASSESSMENT AND PLAN / ED COURSE  I reviewed the triage vital signs and the nursing notes.  Differential diagnosis including internal hemorrhoid, urinary tract infection, constipation, perianal abscess, perirectal abscess, diverticulitis   No tachycardic or bradycardic dysrhythmias while on cardiac telemetry.  RADIOLOGY CT scan abdomen and pelvis with contrast showed findings concerning for fluid collection measuring 4 x 2 cm encircling the anal sphincter concerning for perianal abscess.  Small focus of gas anterior to the fluid.  Mildly dilated cecum with large volume stool. LABS (all labs ordered are listed, but only abnormal results are displayed) Labs interpreted as -    Labs Reviewed  CBC - Abnormal; Notable for the following components:      Result Value   Platelets 404 (*)    All other components within normal limits  BASIC METABOLIC PANEL WITH GFR - Abnormal; Notable for the following components:   Glucose, Bld 112 (*)    BUN 24 (*)    All other components within normal limits  URINALYSIS, W/ REFLEX TO CULTURE (INFECTION SUSPECTED) - Abnormal; Notable for the following components:   Color, Urine YELLOW (*)    APPearance CLOUDY (*)    Hgb urine dipstick LARGE (*)    Protein, ur 30 (*)    Leukocytes,Ua LARGE (*)    Bacteria, UA MANY (*)    Non Squamous Epithelial PRESENT (*)    All other components within normal limits     MDM    Patient's lab work overall reassuring with no significant leukocytosis or anemia.  Creatinine appears to be at baseline with no significant electrolyte abnormality.  UA concerning for urinary tract infection.  Patient has been on Keflex  but has not been on Keflex  greater than 2 days.  Urine was sent for culture and started on IV Rocephin   CT scan resulted with findings concerning for an abscess.   Added on Flagyl.  On exam appears to be more of a perirectal abscess and not perianal.  Consulted general surgery with Dr. Marinda.  Discussed with hospitalist for admission, they will reach out to surgery for further abscess plan.   PROCEDURES:  Critical Care performed: No  .Fecal disimpaction  Date/Time: 02/24/2024 1:58 PM  Performed by: Suzanne Kirsch, MD Authorized by: Suzanne Kirsch, MD  Consent: Verbal consent obtained Risks and benefits: risks, benefits and alternatives were discussed Consent given by: patient Patient understanding: patient states understanding of the procedure being performed Patient consent: the patient's understanding of the procedure matches consent given Procedure consent: procedure consent matches procedure scheduled Relevant documents: relevant documents present and verified Imaging studies: imaging studies available Required items: required blood products, implants, devices, and special equipment available Patient identity confirmed: verbally with patient and hospital-assigned identification number Local anesthesia used: no  Anesthesia: Local anesthesia used: no  Sedation: Patient sedated: no  Patient tolerance: patient tolerated the procedure well with no immediate complications     Patient's presentation is most consistent with acute presentation with potential threat to life or bodily function.   MEDICATIONS ORDERED IN ED: Medications  acetaminophen  (TYLENOL ) tablet 650 mg (has no administration in time range)    Or  acetaminophen  (TYLENOL ) suppository 650 mg (has no administration in time range)  ondansetron  (ZOFRAN ) tablet 4 mg (has no administration in time range)    Or  ondansetron  (ZOFRAN ) injection 4 mg (has no administration in time range)  heparin injection 5,000 Units (has no administration in time range)  senna-docusate (Senokot-S) tablet 1 tablet (has no administration in time range)  metroNIDAZOLE (FLAGYL) IVPB 500 mg  (has no administration in time range)  oxyCODONE  (Oxy IR/ROXICODONE ) immediate release tablet 5 mg (has no administration in time range)  morphine  (PF) 2 MG/ML injection 2 mg (has no administration in time range)  cefTRIAXone  (ROCEPHIN ) 2 g in sodium chloride  0.9 % 100 mL IVPB (has no administration in time range)  lactated ringers infusion (has no administration in time range)  pantoprazole (PROTONIX) EC tablet 40 mg (has no administration in time range)  acetaminophen  (TYLENOL ) tablet 1,000 mg (1,000 mg Oral Given 02/24/24 0911)  sodium chloride  0.9 % bolus 1,000 mL (0 mLs Intravenous Stopped 02/24/24 1144)  iohexol  (OMNIPAQUE ) 300 MG/ML solution 100 mL (100 mLs Intravenous Contrast Given 02/24/24 1104)  cefTRIAXone  (ROCEPHIN ) 2 g in sodium chloride  0.9 % 100 mL IVPB (0  g Intravenous Stopped 02/24/24 1245)  metroNIDAZOLE (FLAGYL) IVPB 500 mg (0 mg Intravenous Stopped 02/24/24 1353)    FINAL CLINICAL IMPRESSION(S) / ED DIAGNOSES   Final diagnoses:  Acute cystitis without hematuria  Perianal abscess     Rx / DC Orders   ED Discharge Orders     None        Note:  This document was prepared using Dragon voice recognition software and may include unintentional dictation errors.   Suzanne Kirsch, MD 02/24/24 1400

## 2024-02-24 NOTE — ED Triage Notes (Signed)
 Pt to ED via [facility] from home for c/o prolapsed hemorrhoids x 1 week. Pt has future appointment, but stated pain was too severe to wait. Pt A&O.

## 2024-02-25 ENCOUNTER — Other Ambulatory Visit: Payer: Self-pay

## 2024-02-25 DIAGNOSIS — N3000 Acute cystitis without hematuria: Secondary | ICD-10-CM

## 2024-02-25 DIAGNOSIS — K2100 Gastro-esophageal reflux disease with esophagitis, without bleeding: Secondary | ICD-10-CM | POA: Diagnosis not present

## 2024-02-25 DIAGNOSIS — K610 Anal abscess: Secondary | ICD-10-CM | POA: Diagnosis not present

## 2024-02-25 LAB — BASIC METABOLIC PANEL WITH GFR
Anion gap: 12 (ref 5–15)
BUN: 13 mg/dL (ref 8–23)
CO2: 23 mmol/L (ref 22–32)
Calcium: 8.5 mg/dL — ABNORMAL LOW (ref 8.9–10.3)
Chloride: 106 mmol/L (ref 98–111)
Creatinine, Ser: 0.52 mg/dL (ref 0.44–1.00)
GFR, Estimated: >60 mL/min (ref >60)
Glucose, Bld: 93 mg/dL (ref 70–99)
Potassium: 3.9 mmol/L (ref 3.5–5.1)
Sodium: 141 mmol/L (ref 135–145)

## 2024-02-25 LAB — CBC
HCT: 31.8 % — ABNORMAL LOW (ref 36.0–46.0)
Hemoglobin: 10.6 g/dL — ABNORMAL LOW (ref 12.0–15.0)
MCH: 30.9 pg (ref 26.0–34.0)
MCHC: 33.3 g/dL (ref 30.0–36.0)
MCV: 92.7 fL (ref 80.0–100.0)
Platelets: 334 K/uL (ref 150–400)
RBC: 3.43 MIL/uL — ABNORMAL LOW (ref 3.87–5.11)
RDW: 12.6 % (ref 11.5–15.5)
WBC: 6.3 K/uL (ref 4.0–10.5)
nRBC: 0 % (ref 0.0–0.2)

## 2024-02-25 MED ORDER — CEPHALEXIN 500 MG PO CAPS: 500.0000 mg | ORAL_CAPSULE | Freq: Three times a day (TID) | ORAL | Status: AC

## 2024-02-25 MED ORDER — DOXYCYCLINE HYCLATE 100 MG PO CAPS
100.0000 mg | ORAL_CAPSULE | Freq: Two times a day (BID) | ORAL | 0 refills | Status: AC
  Filled 2024-02-25: qty 14, 7d supply, fill #0

## 2024-02-25 NOTE — Plan of Care (Signed)
  Problem: Clinical Measurements: Goal: Ability to maintain clinical measurements within normal limits will improve Outcome: Progressing   Problem: Clinical Measurements: Goal: Will remain free from infection Outcome: Progressing   Problem: Clinical Measurements: Goal: Diagnostic test results will improve Outcome: Progressing   Problem: Pain Managment: Goal: General experience of comfort will improve and/or be controlled Outcome: Progressing   Problem: Safety: Goal: Ability to remain free from injury will improve Outcome: Progressing

## 2024-02-25 NOTE — Care Management CC44 (Signed)
 Condition Code 44 Documentation Completed  Patient Details  Name: KATHRINE RIEVES MRN: [id number] Date of Birth: 06-Nov-1950   Condition Code 44 given:  Yes Patient signature on Condition Code 44 notice:  Yes Documentation of 2 MD's agreement:  Yes Code 44 added to claim:  Yes  I, Marinda Cooks, RN, verbally reviewed observation notice.  02/25/2024, 11:16 AM    Marinda Cooks, RN 02/25/2024, 11:15 AM

## 2024-02-25 NOTE — Progress Notes (Signed)
 CC: Perirectal abscess? Subjective: Patient reports feeling much better, pain is improved and having bms, wants to go home  Objective: Vital signs in last 24 hours: Temp:  [98.1 F (36.7 C)-98.2 F (36.8 C)] 98.2 F (36.8 C) (11/01 0740) Pulse Rate:  [85-90] 85 (11/01 0740) Resp:  [18] 18 (11/01 0740) BP: (119)/(49-65) 119/49 (11/01 0740) SpO2:  [98 %-99 %] 99 % (11/01 0740) Last BM Date : 02/25/24  Intake/Output from previous day: 10/31 0701 - 11/01 0700 In: 2483.5 [P.O.:340; I.V.:1854; IV Piggyback:289.5] Out: -  Intake/Output this shift: No intake/output data recorded.  Physical exam:  Anorectal exam deferred today as patient reports doing much better  Lab Results: CBC  Recent Labs    02/24/24 0902 02/25/24 0542  WBC 8.7 6.3  HGB 12.0 10.6*  HCT 37.0 31.8*  PLT 404* 334   BMET Recent Labs    02/24/24 0902 02/25/24 0542  NA 143 141  K 4.9 3.9  CL 103 106  CO2 26 23  GLUCOSE 112* 93  BUN 24* 13  CREATININE 0.60 0.52  CALCIUM 9.0 8.5*   PT/INR No results for input(s): LABPROT, INR in the last 72 hours. ABG No results for input(s): PHART, HCO3 in the last 72 hours.  Invalid input(s): PCO2, PO2  Studies/Results: CT ABDOMEN PELVIS W CONTRAST Result Date: 02/24/2024 CLINICAL DATA:  Lower abdominal pain and constipation. Concern for perianal/rectal abscess EXAM: CT ABDOMEN AND PELVIS WITH CONTRAST TECHNIQUE: Multidetector CT imaging of the abdomen and pelvis was performed using the standard protocol following bolus administration of intravenous contrast. RADIATION DOSE REDUCTION: This exam was performed according to the departmental dose-optimization program which includes automated exposure control, adjustment of the mA and/or kV according to patient size and/or use of iterative reconstruction technique. CONTRAST:  OMNIPAQUE  IOHEXOL  300 MG/ML  SOLN COMPARISON:  CT abdomen and pelvis dated 04/18/2023 FINDINGS: Lower chest: Unchanged 5 mm  subpleural lingular nodule (4:6) and 3 mm peripheral left lower lobe nodule (4:27). No pleural effusion or pneumothorax demonstrated. Partially imaged heart size is normal. Hepatobiliary: Scattered subcentimeter hypodensities, too small to characterize. No intra or extrahepatic biliary ductal dilation. Normal gallbladder. Pancreas: Punctate calcifications in the pancreatic head (2:30), extraluminal to the common bile duct. No main pancreatic ductal dilation. Spleen: Normal in size without focal abnormality. Adrenals/Urinary Tract: No adrenal nodules. Simple/minimally complicated interpolar right renal cyst measures 1.6 cm (2:28). No specific follow-up imaging recommended. Additional bilateral simple-appearing parapelvic cysts. 2 mm nonobstructing stone within the proximal right ureter (2:38). No hydronephrosis. Underdistended urinary bladder demonstrates mild pericystic stranding. Stomach/Bowel: Normal appearance of the stomach. Mildly dilated rectum contains large volume stool. Heterogeneous enhancement at the level of the anal sphincter, where there is a crescentic fluid collection measuring 3.7 x 1.8 cm encircling the anal sphincter from 7 to 2 o'clock (2:90). No CT evidence of fistulization to the rectum or anus. Small focus of gas immediately anterior to the fluid collection (2:90) at the skin surface. Colonic diverticulosis without acute diverticulitis. Appendix is not discretely seen. Vascular/Lymphatic: Aortic atherosclerosis. No enlarged abdominal or pelvic lymph nodes. Reproductive: No adnexal masses. Other: Small volume presacral edema.  No free air. Musculoskeletal: No acute or abnormal lytic or blastic osseous lesions. IMPRESSION: 1. Heterogeneous enhancement at the level of the anal sphincter, where there is a crescentic fluid collection measuring 3.7 x 1.8 cm encircling the anal sphincter from 7 to 2 o'clock, suspicious for perianal abscess. No CT evidence of fistulization to the rectum or anus. Small  focus  of gas immediately anterior to the fluid collection at the skin surface of the perineum would be amenable to direct inspection. 2. Mildly dilated rectum contains large volume stool, likely fecal impaction. 3. Underdistended urinary bladder demonstrates mild pericystic stranding. Recommend correlation with urinalysis to exclude cystitis. 4. A 2 mm nonobstructing stone within the proximal right ureter. No hydronephrosis. 5.  Aortic Atherosclerosis (ICD10-I70.0). Electronically Signed   By: Limin  Xu [name]   On: 02/24/2024 12:11    Anti-infectives: Anti-infectives (From admission, onward)    Start     Dose/Rate Route Frequency Ordered Stop   02/25/24 1000  cefTRIAXone  (ROCEPHIN ) 1 g in sodium chloride  0.9 % 100 mL IVPB  Status:  Discontinued        1 g 200 mL/hr over 30 Minutes Intravenous Every 24 hours 02/24/24 1325 02/24/24 1330   02/25/24 1000  cefTRIAXone  (ROCEPHIN ) 2 g in sodium chloride  0.9 % 100 mL IVPB  Status:  Discontinued        2 g 200 mL/hr over 30 Minutes Intravenous Every 24 hours 02/24/24 1330 02/25/24 1832   02/25/24 0000  cephALEXin  (KEFLEX ) 500 MG capsule       Note to Pharmacy: Fleurette the course   500 mg Oral 3 times daily 02/25/24 0936 03/01/24 2359   02/25/24 0000  doxycycline (VIBRAMYCIN) 100 MG capsule        100 mg Oral 2 times daily 02/25/24 0936 03/03/24 2359   02/24/24 2200  metroNIDAZOLE (FLAGYL) IVPB 500 mg  Status:  Discontinued        500 mg 100 mL/hr over 60 Minutes Intravenous Every 12 hours 02/24/24 1325 02/25/24 1832   02/24/24 1230  metroNIDAZOLE (FLAGYL) IVPB 500 mg        500 mg 100 mL/hr over 60 Minutes Intravenous  Once 02/24/24 1222 02/24/24 1353   02/24/24 1115  cefTRIAXone  (ROCEPHIN ) 2 g in sodium chloride  0.9 % 100 mL IVPB        2 g 200 mL/hr over 30 Minutes Intravenous  Once 02/24/24 1103 02/24/24 1245       Assessment/Plan:  ?Perirectal abscess vs. Inflamation vs. Proctitis, no evidence of fluctuance on exam. Feeling better with one  day of abx, having stool. Okay for dc on abx, has follow up with DR. Sakai on Tuesday. Discussed warning signs including increased pain, fevers, chills or rectal bleeding that would prompt return to ED.   Jayson Endow, [name] [facility]

## 2024-02-25 NOTE — Plan of Care (Signed)

## 2024-02-25 NOTE — Discharge Summary (Signed)
 Physician Discharge Summary   Patient: Savannah Griffin MRN: [id number] DOB: 01-02-1951  Admit date:     02/24/2024  Discharge date: 02/25/24  Discharge Physician: Murvin Mana   PCP: Cyrus Selinda Moose, PA-C   Recommendations at discharge:   Follow-up with PCP in 1 week. Follow-up with general surgery as scheduled on Tuesday.  Discharge Diagnoses: Principal Problem:   Perianal abscess Active Problems:   UTI (urinary tract infection)   GERD (gastroesophageal reflux disease)   Perianal pain  Resolved Problems:   * No resolved hospital problems. [facility] Course: Ms. Raychel Dowler is a 73 year old female with history of hemorrhoids, GERD, rectal polyp, vocal cord polyp, history of right sided breast cancer who presents to the ED for chief concerns of severe anal pain. UA was positive for large leukocytes.  CT abdomen pelvis with contrast: Was read as heterogenous enhancement at the level of the anal sphincter measuring 3.7 x 1.8 cm encircling the anal sphincter from 7-2 o'clock suspicious for perianal abscess.   Patient is seen by general surgery, does not need I&D at this time.  Treated with Flagyl and Rocephin , condition much improved today.  She also had a large bowel movement, rectal pain is better.  Condition has improved, patient does not have sepsis.  She is medically stable for discharge.  She has scheduled appointment on Tuesday with Dr. Tye, at that time we will decide if patient need I&D.  Assessment and Plan: * Perianal abscess Parianal pain with constipation. Condition is improving, discussed with general surgery, no need for I&D.  Okay to discharge from their standpoint.  She still has Keflex  prescribed for UTI, will add doxycycline. She has appointment with general surgery on Tuesday. She also has sitz bath at home.  GERD (gastroesophageal reflux disease) As needed Protonix  UTI (urinary tract infection) Recently urine culture had insignificant growth.   Has prescribed Keflex .  Will continue that to finish her course for the purpose of perianal abscess        Consultants: General suregry Procedures performed: None  Disposition: Home Diet recommendation:  Discharge Diet Orders (From admission, onward)     Start     Ordered   02/25/24 0000  Diet - low sodium heart healthy        02/25/24 0936           Cardiac diet DISCHARGE MEDICATION: Allergies as of 02/25/2024       Reactions   Augmentin [amoxicillin-pot Clavulanate] Other (See Comments)   Headache and GI upset   Avelox  [moxifloxacin ] Other (See Comments)   Headache and GI upset   Biaxin [clarithromycin] Other (See Comments)   Gi upset   Cefdinir Other (See Comments)   Gi-Upset   Clindamycin/lincomycin Other (See Comments)   GI upset   Doxycycline Other (See Comments)    GI upset   Gluten Meal Other (See Comments)   Headache and   Lactose Intolerance (gi) Other (See Comments)   Gi-upset, any kind of dairy   Macrodantin [nitrofurantoin Macrocrystal] Other (See Comments)   Headache    Septra [sulfamethoxazole-trimethoprim] Other (See Comments)   Headache   Escitalopram Other (See Comments)   And shakiness        Medication List     STOP taking these medications    oxyCODONE -acetaminophen  5-325 MG tablet Commonly known as: PERCOCET/ROXICET   traMADol 50 MG tablet Commonly known as: ULTRAM       TAKE these medications    acetaminophen  500 MG tablet  Commonly known as: TYLENOL  Take 1,000 mg by mouth daily.   aspirin 81 MG chewable tablet Chew 1 tablet (81 mg total) by mouth 2 (two) times daily.   celecoxib 200 MG capsule Commonly known as: CELEBREX Take 1 capsule (200 mg total) by mouth 2 (two) times daily.   cephALEXin  500 MG capsule Commonly known as: KEFLEX  Take 1 capsule (500 mg total) by mouth 3 (three) times daily for 5 days.   cyanocobalamin 1000 MCG/ML injection Commonly known as: VITAMIN B12 Inject 1,000 mcg into the muscle  every 30 (thirty) days.   diclofenac Sodium 1 % Gel Commonly known as: VOLTAREN Apply 2 g topically daily as needed (Knee pain).   doxycycline 100 MG capsule Commonly known as: VIBRAMYCIN Take 1 capsule (100 mg total) by mouth 2 (two) times daily for 7 days.   LORazepam  0.5 MG tablet Commonly known as: ATIVAN  Take 0.5 mg by mouth every 8 (eight) hours as needed for anxiety.   ondansetron  4 MG disintegrating tablet Commonly known as: ZOFRAN -ODT Take 1 tablet (4 mg total) by mouth every 8 (eight) hours as needed for nausea or vomiting.   oxyCODONE  5 MG immediate release tablet Commonly known as: Oxy IR/ROXICODONE  Take 1 tablet (5 mg total) by mouth every 4 (four) hours as needed for moderate pain (pain score 4-6) (pain score 4-6).               Discharge Care Instructions  (From admission, onward)           Start     Ordered   02/25/24 0000  Discharge wound care:       Comments: Follow with surgery on tuesday   02/25/24 9063            Follow-up Information     Cyrus Mayo Hestle, PA-C Follow up in 1 week(s).   Specialty: Family Medicine Contact information: [address] [place] [address] [phone]         Tye Millet, DO Follow up.   Specialties: General Surgery, Surgery Why: as scheduled on Tuesday Contact information: [address] [place] [address] [phone]                Discharge Exam: Fredricka Weights   02/24/24 0843  Weight: 59 kg   General exam: Appears calm and comfortable  Respiratory system: Clear to auscultation. Respiratory effort normal. Cardiovascular system: S1 & S2 heard, RRR. No JVD, murmurs, rubs, gallops or clicks. No pedal edema. Gastrointestinal system: Abdomen is nondistended, soft and nontender. No organomegaly or masses felt. Normal bowel sounds heard. Central nervous system: Alert and oriented. No focal neurological deficits. Extremities: Symmetric 5 x 5 power. Skin: No rashes,  lesions or ulcers Psychiatry: Judgement and insight appear normal. Mood & affect appropriate.    Condition at discharge: good  The results of significant diagnostics from this hospitalization (including imaging, microbiology, ancillary and laboratory) are listed below for reference.   Imaging Studies: CT ABDOMEN PELVIS W CONTRAST Result Date: 02/24/2024 CLINICAL DATA:  Lower abdominal pain and constipation. Concern for perianal/rectal abscess EXAM: CT ABDOMEN AND PELVIS WITH CONTRAST TECHNIQUE: Multidetector CT imaging of the abdomen and pelvis was performed using the standard protocol following bolus administration of intravenous contrast. RADIATION DOSE REDUCTION: This exam was performed according to the departmental dose-optimization program which includes automated exposure control, adjustment of the mA and/or kV according to patient size and/or use of iterative reconstruction technique. CONTRAST:  OMNIPAQUE  IOHEXOL  300 MG/ML  SOLN COMPARISON:  CT abdomen and pelvis dated 04/18/2023 FINDINGS: Lower chest: Unchanged 5 mm subpleural lingular nodule (4:6) and 3 mm peripheral left lower lobe nodule (4:27). No pleural effusion or pneumothorax demonstrated. Partially imaged heart size is normal. Hepatobiliary: Scattered subcentimeter hypodensities, too small to characterize. No intra or extrahepatic biliary ductal dilation. Normal gallbladder. Pancreas: Punctate calcifications in the pancreatic head (2:30), extraluminal to the common bile duct. No main pancreatic ductal dilation. Spleen: Normal in size without focal abnormality. Adrenals/Urinary Tract: No adrenal nodules. Simple/minimally complicated interpolar right renal cyst measures 1.6 cm (2:28). No specific follow-up imaging recommended. Additional bilateral simple-appearing parapelvic cysts. 2 mm nonobstructing stone within the proximal right ureter (2:38). No hydronephrosis. Underdistended urinary bladder demonstrates mild pericystic  stranding. Stomach/Bowel: Normal appearance of the stomach. Mildly dilated rectum contains large volume stool. Heterogeneous enhancement at the level of the anal sphincter, where there is a crescentic fluid collection measuring 3.7 x 1.8 cm encircling the anal sphincter from 7 to 2 o'clock (2:90). No CT evidence of fistulization to the rectum or anus. Small focus of gas immediately anterior to the fluid collection (2:90) at the skin surface. Colonic diverticulosis without acute diverticulitis. Appendix is not discretely seen. Vascular/Lymphatic: Aortic atherosclerosis. No enlarged abdominal or pelvic lymph nodes. Reproductive: No adnexal masses. Other: Small volume presacral edema.  No free air. Musculoskeletal: No acute or abnormal lytic or blastic osseous lesions. IMPRESSION: 1. Heterogeneous enhancement at the level of the anal sphincter, where there is a crescentic fluid collection measuring 3.7 x 1.8 cm encircling the anal sphincter from 7 to 2 o'clock, suspicious for perianal abscess. No CT evidence of fistulization to the rectum or anus. Small focus of gas immediately anterior to the fluid collection at the skin surface of the perineum would be amenable to direct inspection. 2. Mildly dilated rectum contains large volume stool, likely fecal impaction. 3. Underdistended urinary bladder demonstrates mild pericystic stranding. Recommend correlation with urinalysis to exclude cystitis. 4. A 2 mm nonobstructing stone within the proximal right ureter. No hydronephrosis. 5.  Aortic Atherosclerosis (ICD10-I70.0). Electronically Signed   By: Limin  Xu [name]   On: 02/24/2024 12:11   DG Knee Right Port Result Date: 02/06/2024 CLINICAL DATA:  Right knee arthroplasty. EXAM: PORTABLE RIGHT KNEE - 1-2 VIEW COMPARISON:  None Available. FINDINGS: Right total knee arthroplasty with surgical drains and skin staples in place. Scattered associated subcutaneous and joint air. Prior external fixator lucencies are seen in the  distal femur and proximal tibia. IMPRESSION: Right total knee arthroplasty with expected postoperative findings. Electronically Signed   By: Newell Eke [name]   On: 02/06/2024 11:25    Microbiology: Results for orders placed or performed during the hospital encounter of 01/25/24  Surgical pcr screen     Status: None   Collection Time: 01/25/24  8:49 AM   Specimen: Nasal Mucosa; Nasal Swab  Result Value Ref Range Status   MRSA, PCR NEGATIVE NEGATIVE Final   Staphylococcus aureus NEGATIVE NEGATIVE Final    Comment: (NOTE) The Xpert SA Assay (FDA approved for NASAL specimens in patients 74 years of age and older), is one component of a comprehensive surveillance program. It is not intended to diagnose infection nor to guide or monitor treatment. Performed at [facility], [address] [place], [place] [address]     Labs: CBC: Recent Labs  Lab 02/24/24 0902 02/25/24 0542  WBC 8.7 6.3  HGB 12.0 10.6*  HCT 37.0 31.8*  MCV 93.2 92.7  PLT 404* 334   Basic Metabolic  Panel: Recent Labs  Lab 02/24/24 0902 02/25/24 0542  NA 143 141  K 4.9 3.9  CL 103 106  CO2 26 23  GLUCOSE 112* 93  BUN 24* 13  CREATININE 0.60 0.52  CALCIUM 9.0 8.5*   Liver Function Tests: No results for input(s): AST, ALT, ALKPHOS, BILITOT, PROT, ALBUMIN in the last 168 hours. CBG: No results for input(s): GLUCAP in the last 168 hours.  Discharge time spent: 35 minutes.  Signed: Murvin Mana, MD [facility] 02/25/2024
